# Patient Record
Sex: Female | Born: 2005
Health system: Southern US, Community
[De-identification: ages and names within clinical notes are randomized; demographics above are authoritative.]

## PROBLEM LIST (undated history)

## (undated) DIAGNOSIS — F32A Depression, unspecified: Secondary | ICD-10-CM

## (undated) DIAGNOSIS — F419 Anxiety disorder, unspecified: Secondary | ICD-10-CM

## (undated) DIAGNOSIS — K141 Geographic tongue: Secondary | ICD-10-CM

## (undated) DIAGNOSIS — F329 Major depressive disorder, single episode, unspecified: Secondary | ICD-10-CM

## (undated) DIAGNOSIS — F840 Autistic disorder: Secondary | ICD-10-CM

## (undated) DIAGNOSIS — J45909 Unspecified asthma, uncomplicated: Secondary | ICD-10-CM

## (undated) DIAGNOSIS — F4310 Post-traumatic stress disorder, unspecified: Secondary | ICD-10-CM

---
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
## 2024-02-04 ENCOUNTER — Encounter (HOSPITAL_BASED_OUTPATIENT_CLINIC_OR_DEPARTMENT_OTHER): Payer: Self-pay

## 2024-02-04 ENCOUNTER — Emergency Department (HOSPITAL_BASED_OUTPATIENT_CLINIC_OR_DEPARTMENT_OTHER): Payer: 59

## 2024-02-04 ENCOUNTER — Emergency Department (HOSPITAL_BASED_OUTPATIENT_CLINIC_OR_DEPARTMENT_OTHER): Payer: 59 | Admitting: Radiology

## 2024-02-04 ENCOUNTER — Observation Stay (HOSPITAL_BASED_OUTPATIENT_CLINIC_OR_DEPARTMENT_OTHER)
Admission: EM | Admit: 2024-02-04 | Discharge: 2024-02-07 | Disposition: A | Discharge status: Home / Self Care | Payer: 59 | Attending: Internal Medicine | Admitting: Internal Medicine

## 2024-02-04 ENCOUNTER — Other Ambulatory Visit: Payer: Self-pay

## 2024-02-04 DIAGNOSIS — R55 Syncope and collapse: Principal | ICD-10-CM | POA: Insufficient documentation

## 2024-02-04 DIAGNOSIS — Z79899 Other long term (current) drug therapy: Secondary | ICD-10-CM | POA: Insufficient documentation

## 2024-02-04 DIAGNOSIS — Z1152 Encounter for screening for COVID-19: Secondary | ICD-10-CM | POA: Insufficient documentation

## 2024-02-04 DIAGNOSIS — F840 Autistic disorder: Secondary | ICD-10-CM | POA: Diagnosis not present

## 2024-02-04 DIAGNOSIS — R519 Headache, unspecified: Secondary | ICD-10-CM | POA: Insufficient documentation

## 2024-02-04 DIAGNOSIS — E876 Hypokalemia: Secondary | ICD-10-CM | POA: Diagnosis not present

## 2024-02-04 DIAGNOSIS — J45909 Unspecified asthma, uncomplicated: Secondary | ICD-10-CM | POA: Diagnosis not present

## 2024-02-04 LAB — RAPID URINE DRUG SCREEN, HOSP PERFORMED
Amphetamines: NOT DETECTED
Barbiturates: NOT DETECTED
Benzodiazepines: NOT DETECTED
Cocaine: NOT DETECTED
Opiates: NOT DETECTED
Tetrahydrocannabinol: POSITIVE — AB

## 2024-02-04 LAB — RESP PANEL BY RT-PCR (RSV, FLU A&B, COVID)  RVPGX2
Influenza A by PCR: NEGATIVE
Influenza B by PCR: NEGATIVE
Resp Syncytial Virus by PCR: NEGATIVE
SARS Coronavirus 2 by RT PCR: NEGATIVE

## 2024-02-04 LAB — CBC WITH DIFFERENTIAL/PLATELET
Abs Immature Granulocytes: 0.01 10*3/uL (ref 0.00–0.07)
Basophils Absolute: 0.1 10*3/uL (ref 0.0–0.1)
Basophils Relative: 1 %
Eosinophils Absolute: 0 10*3/uL (ref 0.0–0.5)
Eosinophils Relative: 1 %
HCT: 36.1 % (ref 36.0–46.0)
Hemoglobin: 12 g/dL (ref 12.0–15.0)
Immature Granulocytes: 0 %
Lymphocytes Relative: 48 %
Lymphs Abs: 3.8 10*3/uL (ref 0.7–4.0)
MCH: 30.5 pg (ref 26.0–34.0)
MCHC: 33.2 g/dL (ref 30.0–36.0)
MCV: 91.9 fL (ref 80.0–100.0)
Monocytes Absolute: 0.6 10*3/uL (ref 0.1–1.0)
Monocytes Relative: 7 %
Neutro Abs: 3.3 10*3/uL (ref 1.7–7.7)
Neutrophils Relative %: 43 %
Platelets: 332 10*3/uL (ref 150–400)
RBC: 3.93 MIL/uL (ref 3.87–5.11)
RDW: 13.6 % (ref 11.5–15.5)
WBC: 7.8 10*3/uL (ref 4.0–10.5)
nRBC: 0 % (ref 0.0–0.2)

## 2024-02-04 LAB — MAGNESIUM: Magnesium: 2.1 mg/dL (ref 1.7–2.4)

## 2024-02-04 LAB — TROPONIN I (HIGH SENSITIVITY): Troponin I (High Sensitivity): <2 ng/L (ref <18)

## 2024-02-04 LAB — COMPREHENSIVE METABOLIC PANEL
ALT: 11 U/L (ref 0–44)
AST: 13 U/L — ABNORMAL LOW (ref 15–41)
Albumin: 4.6 g/dL (ref 3.5–5.0)
Alkaline Phosphatase: 55 U/L (ref 38–126)
Anion gap: 10 (ref 5–15)
BUN: 5 mg/dL — ABNORMAL LOW (ref 6–20)
CO2: 25 mmol/L (ref 22–32)
Calcium: 9.9 mg/dL (ref 8.9–10.3)
Chloride: 103 mmol/L (ref 98–111)
Creatinine, Ser: 0.89 mg/dL (ref 0.44–1.00)
GFR, Estimated: >60 mL/min (ref >60)
Glucose, Bld: 91 mg/dL (ref 70–99)
Potassium: 3.3 mmol/L — ABNORMAL LOW (ref 3.5–5.1)
Sodium: 138 mmol/L (ref 135–145)
Total Bilirubin: 0.8 mg/dL (ref 0.0–1.2)
Total Protein: 8 g/dL (ref 6.5–8.1)

## 2024-02-04 MED ORDER — SODIUM CHLORIDE 0.9 % IV BOLUS
1000.0000 mL | Freq: Once | INTRAVENOUS | Status: AC
  Administered 2024-02-04: 1000 mL via INTRAVENOUS

## 2024-02-04 NOTE — ED Triage Notes (Signed)
 During triage patient began gasping and holding chest.  Gasping breathing.  Becomes unresponsive for short period.  Body become rigid for short period.  Becomes responsive quickly .  Two episodes in triage  First episode patient  fell out of wheel chair and assisted to floor.  Patient states when this happens feels pain to chest and blurry vision followed by flashing lights and then blackness.

## 2024-02-04 NOTE — Progress Notes (Signed)
 Plan of Care Note for accepted transfer   Patient: Carolyn Pitts MRN: [id number]   DOA: 02/04/2024  Facility requesting transfer: [facility]   Requesting Provider: Dr. Cottie   Reason for transfer: Recurrent LOC   Facility course: 17 year old female with history of autism, PTSD, OCD, depression, and anxiety presents with recurrent episodes of loss of consciousness.  She has had multiple recent episodes marked by chest discomfort, acute shortness of breath, and vision change followed by brief LOC and subsequent lethargy.  She is in sinus tachycardia on EKG.  No acute findings seen on head CT.  Basic labs are essentially normal aside from mild hypokalemia.  She was given 1 L of NS.  Plan of care: The patient is accepted for admission to Telemetry unit, at [facility].   Author: Evalene GORMAN Sprinkles, MD 02/04/2024  Check [email or web address] for on-call coverage.  Nursing staff, Please call [facility] Admits & Consults System-Wide number on Amion as soon as patient's arrival, so appropriate admitting provider can evaluate the pt.

## 2024-02-04 NOTE — ED Triage Notes (Signed)
 Last nigh passed out at work.  States happen  last night  at home and this am at MD office.  States when episodes happen has short of breath and chest pain Mother states looks like neck swells.  States has happen about five time.  States lethargic but responsive afterwards

## 2024-02-04 NOTE — ED Provider Notes (Signed)
 [place] EMERGENCY DEPARTMENT AT [facility] Provider Note   CSN: [id number] Arrival date & time: 02/04/24  1506     History  Chief Complaint  Patient presents with   Loss of Consciousness    Carolyn Pitts is a 18 y.o. female presenting to the ED with complaint of chronic headaches, weakness, syncope for his near syncope episodes.  Patient's mother is present bedside.  They report that she has had headaches for a long long time.  She has been referred to neurology but not seen one yet and mother ports has not had neuroimaging.  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  She vomited several times in the bathroom and during these episodes she may have briefly lost consciousness.  They went to see her pediatrician today where she had another similar episode where she said my vision went dark and I feel like I was going to pass out.  She says she been having burning substernal chest pain since yesterday.  She and her mother deny that she has a prior history of syncope, or any other significant medical problems other than a possible vitamin deficiency.  HPI     Home Medications Prior to Admission medications   Medication Sig Start Date End Date Taking? Authorizing Provider  acetaminophen  (TYLENOL ) 500 MG tablet Take 500-1,000 mg by mouth every 6 (six) hours as needed for mild pain or headache.    [provider]  albuterol  (VENTOLIN  HFA) 108 (90 Base) MCG/ACT inhaler Inhale 1-2 puffs into the lungs every 6 (six) hours as needed for wheezing or shortness of breath.    [provider]  buPROPion (WELLBUTRIN XL) 150 MG 24 hr tablet Take 150 mg by mouth in the morning. 06/29/20   [provider]  calcium  carbonate (TUMS - DOSED IN MG ELEMENTAL CALCIUM ) 500 MG chewable tablet Chew 1-2 tablets by mouth as needed for indigestion or heartburn.     [provider]  famotidine  (PEPCID ) 40 MG tablet Take 1 tablet (40 mg total) by mouth daily. 07/03/20   Erasmo Waddell SAUNDERS, NP  FLUoxetine  (PROZAC ) 20 MG capsule Take 1 capsule (20 mg total) by mouth at bedtime. Patient not taking: Reported on 07/03/2020 09/01/19   Jonnalagadda, Janardhana, MD  HYDROcodone -acetaminophen  (NORCO/VICODIN) 5-325 MG tablet Take 1 tablet by mouth every 4 (four) hours as needed. 02/06/23   Yolande Lamar BROCKS, MD  hydrOXYzine  (ATARAX /VISTARIL ) 10 MG tablet Take 1 tablet (10 mg total) by mouth 3 (three) times daily as needed for anxiety. Patient not taking: Reported on 07/03/2020 09/01/19   Jonnalagadda, Janardhana, MD  ibuprofen  (ADVIL ) 200 MG tablet Take 400 mg by mouth every 6 (six) hours as needed for headache, mild pain or cramping.    [provider]  JUNEL FE 1/20 1-20 MG-MCG tablet Take 1 tablet by mouth See admin instructions. Take 1 tablet by mouth at bedtime on non-active days of monthly cycle 08/03/19   [provider]  methylphenidate 18 MG PO CR tablet Take 18 mg by mouth in the morning. 05/23/20   [provider]  Multiple Vitamins-Minerals (ONE-A-DAY TEEN ADVANTAGE/HER) TABS Take 1 tablet by mouth daily with breakfast.    [provider]  omeprazole (PRILOSEC) 40 MG capsule Take 40 mg by mouth daily before breakfast. 06/23/20   [provider]  ondansetron  (ZOFRAN ) 4 MG tablet Take 1 tablet (4 mg total) by mouth  every 6 (six) hours. 02/06/23   Yolande Lamar BROCKS, MD  polyethylene glycol (MIRALAX ) 17 g packet Take 17 g by mouth daily. 07/03/20   Erasmo Waddell SAUNDERS, NP  traZODone (DESYREL) 50 MG tablet Take 50 mg by mouth at bedtime. 06/29/20   [provider]  dicyclomine  (BENTYL ) 20 MG tablet Take 0.5 tablets (10 mg total) by mouth 2 (two) times daily as needed for spasms. Patient not taking: Reported on 08/26/2019 06/11/18 08/27/19  Lum Dorothyann RAMAN, NP      Allergies    Patient has no allergy information on record.     Review of Systems   Review of Systems  Physical Exam Updated Vital Signs BP 107/79   Pulse (!) 59   Temp 98.2 F (36.8 C) (Oral)   Resp 18   LMP 01/19/2024 (Approximate)   SpO2 99%  Physical Exam Constitutional:      General: She is not in acute distress. HENT:     Head: Normocephalic and atraumatic.  Eyes:     Conjunctiva/sclera: Conjunctivae normal.     Pupils: Pupils are equal, round, and reactive to light.  Cardiovascular:     Rate and Rhythm: Normal rate and regular rhythm.  Pulmonary:     Effort: Pulmonary effort is normal. No respiratory distress.  Abdominal:     General: There is no distension.     Tenderness: There is no abdominal tenderness.  Skin:    General: Skin is warm and dry.  Neurological:     General: No focal deficit present.     Mental Status: She is alert and oriented to person, place, and time. Mental status is at baseline.     Sensory: No sensory deficit.     Motor: No weakness.  Psychiatric:        Mood and Affect: Mood normal.        Behavior: Behavior normal.     ED Results / Procedures / Treatments   Labs (all labs ordered are listed, but only abnormal results are displayed) Labs Reviewed  COMPREHENSIVE METABOLIC PANEL - Abnormal; Notable for the following components:      Result Value   Potassium 3.3 (*)    BUN 5 (*)    AST 13 (*)    All other components within normal limits  RAPID URINE DRUG SCREEN, HOSP PERFORMED - Abnormal; Notable for the following components:   Tetrahydrocannabinol POSITIVE (*)    All other components within normal limits  RESP PANEL BY RT-PCR (RSV, FLU A&B, COVID)  RVPGX2  CBC WITH DIFFERENTIAL/PLATELET  MAGNESIUM   TROPONIN I (HIGH SENSITIVITY)    EKG EKG Interpretation Date/Time:  Wednesday February 04 2024 15:34:20 EST Ventricular Rate:  128 PR Interval:  164 QRS Duration:  86 QT Interval:  292 QTC Calculation: 426 R Axis:   34  Text Interpretation: Sinus tachycardia  Lead III isoelectric  poor amplitude When compared with ECG of 02-Dec-2021 12:47, PREVIOUS ECG IS PRESENT No significant changes Confirmed by Cottie Cough [phone]) on 02/04/2024 4:01:44 PM  Radiology DG Chest 2 View Result Date: 02/04/2024 CLINICAL DATA:  Syncope. EXAM: CHEST - 2 VIEW COMPARISON:  February 06, 2023. FINDINGS: The heart size and mediastinal contours are within normal limits. Both lungs are clear. The visualized skeletal structures are unremarkable. IMPRESSION: No active cardiopulmonary disease. Electronically Signed   By: Lynwood Landy Raddle M.D.   On: 02/04/2024 17:55   CT Head Wo Contrast Result Date: 02/04/2024 CLINICAL DATA:  Transient ischemic attack (TIA).  Syncope.  EXAM: CT HEAD WITHOUT CONTRAST TECHNIQUE: Contiguous axial images were obtained from the base of the skull through the vertex without intravenous contrast. RADIATION DOSE REDUCTION: This exam was performed according to the departmental dose-optimization program which includes automated exposure control, adjustment of the mA and/or kV according to patient size and/or use of iterative reconstruction technique. COMPARISON:  None Available. FINDINGS: Brain: There is no evidence of an acute infarct, intracranial hemorrhage, mass, midline shift, or extra-axial fluid collection. Cerebral volume is normal. The ventricles are normal in size. Vascular: No hyperdense vessel. Skull: No fracture or suspicious osseous lesion. Sinuses/Orbits: Visualized paranasal sinuses and mastoid air cells are clear. Unremarkable included orbits. Other: None. IMPRESSION: Negative head CT. Electronically Signed   By: Dasie Hamburg M.D.   On: 02/04/2024 17:01    Procedures Procedures    Medications Ordered in ED Medications  sodium chloride  0.9 % bolus 1,000 mL (0 mLs Intravenous Stopped 02/04/24 1900)    ED Course/ Medical Decision Making/ A&P Clinical Course as of 02/05/24 0029  Wed Feb 04, 2024  1919 I spoke to the patient privately, then with permission with her  mother present, about the THC positive in urine.  She reports she does smoke or use THC cartridges on a near nightly basis but denies any new change in his habits, not consuming any edibles, and neither of them believe that the marijuana is linked to these current symptoms.  Plan to admit for telemetry monitoring and echocardiogram [MT]    Clinical Course User Index [MT] Masyn Fullam, Donnice PARAS, MD                                 Medical Decision Making Risk Decision regarding hospitalization.   This patient presents to the ED with concern for near syncope for syncope, nausea vomiting, lightheadedness. This involves an extensive number of treatment options, and is a complaint that carries with it a high risk of complications and morbidity.  The differential diagnosis includes arrhythmia versus anemia versus dehydration versus other   Additional history obtained from the patient's mother at bedside   I ordered and personally interpreted labs.  The pertinent results include: No emergent findings.  Very mild hypokalemia.  UDS positive for THC, see ED course below  I ordered imaging studies including x-ray of the chest, CT head I independently visualized and interpreted imaging which showed no emergent findings I agree with the radiologist interpretation  The patient was maintained on a cardiac monitor.  I personally viewed and interpreted the cardiac monitored which showed an underlying rhythm of: Sinus rhythm  Per my interpretation the patient's ECG shows sinus rhythm without acute ischemic findings or evidence of dangerous arrhythmia  I ordered medication including IV fluid bolus for hydration I have reviewed the patients home medicines and have made adjustments as needed  Test Considered: Very low suspicion for acute PE, patient is PERC negative.  Doubt meningitis.  After the interventions noted above, I reevaluated the patient and found that they have: stayed the  same  Dispostion:  After consideration of the diagnostic results and the patients response to treatment, I feel that the patent would benefit from observation admission on telemetry for potential echocardiogram in the morning.  Patient and her mother reiterates that these syncope spells are quite new with no prior history to suggest vasovagal episodes.  There is no clear inciting factor -potentially viral, but unclear.  They are in agreement  with plan for observation stay and echocardiogram.  Separately the patient has had near daily headaches extending back for several months, which she describes migraines.  They have been looking for a neurology referral, not conducive for the headache.  At this point I do not see any dangerous red flag etiology with her headaches to suggest that this is the underlying cause of her syncope.  Specifically I do not see evidence per history of subarachnoid hemorrhage, acute meningitis, or other dangerous medical condition related to her headache.         Final Clinical Impression(s) / ED Diagnoses Final diagnoses:  Near syncope  Nonintractable headache, unspecified chronicity pattern, unspecified headache type    Rx / DC Orders ED Discharge Orders          Ordered    Ambulatory referral to Neurology       Comments: An appointment is requested in approximately: 2 weeks Chronic migraine headaches   02/04/24 1920              Cottie Donnice PARAS, MD 02/05/24 [phone]

## 2024-02-04 NOTE — ED Provider Triage Note (Signed)
 Emergency Medicine Provider Triage Evaluation Note  Carolyn Pitts , a 18 y.o. female  was evaluated in triage.  Pt complains of syncopal episodes that have occurred numerous times since yesterday.  No prior history of the same.  She does have history of migraines.  According to mom these episodes last about 10 to 15 seconds.  She was sent here from PCP office.  She had a similar episode in triage.  She was clutching her chest and stating she could not breathe.  There was no postictal phase after.  No full loss of consciousness.  She slid down to the floor out of the chair  Review of Systems  Positive: As above Negative: As above  Physical Exam  BP 114/76 (BP Location: Left Arm)   Pulse 70   Temp 97.9 F (36.6 C)   Resp 18   SpO2 100%  Gen:   Awake, no distress. Resp:  Normal effort  MSK:   Moves extremities without difficulty  Other:    Medical Decision Making  Medically screening exam initiated at 3:31 PM.  Appropriate orders placed.  Carolyn Pitts was informed that the remainder of the evaluation will be completed by another provider, this initial triage assessment does not replace that evaluation, and the importance of remaining in the ED until their evaluation is complete.    Carolyn Loge, PA-C 02/04/24 1535

## 2024-02-05 ENCOUNTER — Ambulatory Visit (HOSPITAL_COMMUNITY): Payer: 59

## 2024-02-05 ENCOUNTER — Encounter (HOSPITAL_COMMUNITY): Payer: Self-pay | Admitting: Family Medicine

## 2024-02-05 ENCOUNTER — Observation Stay (HOSPITAL_COMMUNITY): Payer: 59

## 2024-02-05 DIAGNOSIS — Z79899 Other long term (current) drug therapy: Secondary | ICD-10-CM | POA: Diagnosis not present

## 2024-02-05 DIAGNOSIS — R519 Headache, unspecified: Secondary | ICD-10-CM | POA: Diagnosis not present

## 2024-02-05 DIAGNOSIS — F840 Autistic disorder: Secondary | ICD-10-CM | POA: Diagnosis not present

## 2024-02-05 DIAGNOSIS — E876 Hypokalemia: Secondary | ICD-10-CM

## 2024-02-05 DIAGNOSIS — R569 Unspecified convulsions: Secondary | ICD-10-CM

## 2024-02-05 DIAGNOSIS — R55 Syncope and collapse: Principal | ICD-10-CM

## 2024-02-05 DIAGNOSIS — J45909 Unspecified asthma, uncomplicated: Secondary | ICD-10-CM | POA: Insufficient documentation

## 2024-02-05 DIAGNOSIS — Z1152 Encounter for screening for COVID-19: Secondary | ICD-10-CM | POA: Diagnosis not present

## 2024-02-05 LAB — BASIC METABOLIC PANEL
Anion gap: 9 (ref 5–15)
BUN: <5 mg/dL — ABNORMAL LOW (ref 6–20)
CO2: 23 mmol/L (ref 22–32)
Calcium: 9.1 mg/dL (ref 8.9–10.3)
Chloride: 106 mmol/L (ref 98–111)
Creatinine, Ser: 0.85 mg/dL (ref 0.44–1.00)
GFR, Estimated: >60 mL/min (ref >60)
Glucose, Bld: 84 mg/dL (ref 70–99)
Potassium: 4.4 mmol/L (ref 3.5–5.1)
Sodium: 138 mmol/L (ref 135–145)

## 2024-02-05 LAB — ECHOCARDIOGRAM COMPLETE
AR max vel: 2.19 cm2
AV Area VTI: 2.4 cm2
AV Area mean vel: 2.16 cm2
AV Mean grad: 4 mm[Hg]
AV Peak grad: 7.2 mm[Hg]
Ao pk vel: 1.34 m/s
Area-P 1/2: 3.12 cm2
S' Lateral: 3.3 cm

## 2024-02-05 LAB — PREGNANCY, URINE: Preg Test, Ur: NEGATIVE

## 2024-02-05 LAB — CBC WITH DIFFERENTIAL/PLATELET
Abs Immature Granulocytes: 0.01 10*3/uL (ref 0.00–0.07)
Basophils Absolute: 0.1 10*3/uL (ref 0.0–0.1)
Basophils Relative: 1 %
Eosinophils Absolute: 0.1 10*3/uL (ref 0.0–0.5)
Eosinophils Relative: 1 %
HCT: 33.2 % — ABNORMAL LOW (ref 36.0–46.0)
Hemoglobin: 11.2 g/dL — ABNORMAL LOW (ref 12.0–15.0)
Immature Granulocytes: 0 %
Lymphocytes Relative: 56 %
Lymphs Abs: 3.1 10*3/uL (ref 0.7–4.0)
MCH: 30.9 pg (ref 26.0–34.0)
MCHC: 33.7 g/dL (ref 30.0–36.0)
MCV: 91.5 fL (ref 80.0–100.0)
Monocytes Absolute: 0.4 10*3/uL (ref 0.1–1.0)
Monocytes Relative: 7 %
Neutro Abs: 2 10*3/uL (ref 1.7–7.7)
Neutrophils Relative %: 35 %
Platelets: 273 10*3/uL (ref 150–400)
RBC: 3.63 MIL/uL — ABNORMAL LOW (ref 3.87–5.11)
RDW: 13.5 % (ref 11.5–15.5)
WBC: 5.5 10*3/uL (ref 4.0–10.5)
nRBC: 0 % (ref 0.0–0.2)

## 2024-02-05 LAB — VITAMIN B12: Vitamin B-12: 268 pg/mL (ref 180–914)

## 2024-02-05 LAB — T4, FREE: Free T4: 0.99 ng/dL (ref 0.61–1.12)

## 2024-02-05 LAB — FOLATE: Folate: 25 ng/mL (ref >5.9)

## 2024-02-05 LAB — D-DIMER, QUANTITATIVE: D-Dimer, Quant: <0.27 ug{FEU}/mL (ref 0.00–0.50)

## 2024-02-05 LAB — VITAMIN D 25 HYDROXY (VIT D DEFICIENCY, FRACTURES): Vit D, 25-Hydroxy: 25.21 ng/mL — ABNORMAL LOW (ref 30–100)

## 2024-02-05 LAB — MAGNESIUM: Magnesium: 2.1 mg/dL (ref 1.7–2.4)

## 2024-02-05 LAB — TSH: TSH: 1.617 u[IU]/mL (ref 0.350–4.500)

## 2024-02-05 LAB — HIV ANTIBODY (ROUTINE TESTING W REFLEX): HIV Screen 4th Generation wRfx: NONREACTIVE

## 2024-02-05 MED ORDER — POTASSIUM CHLORIDE 20 MEQ PO PACK
20.0000 meq | PACK | Freq: Once | ORAL | Status: AC
  Administered 2024-02-05: 20 meq via ORAL
  Filled 2024-02-05: qty 1

## 2024-02-05 MED ORDER — ENOXAPARIN SODIUM 40 MG/0.4ML IJ SOSY
40.0000 mg | PREFILLED_SYRINGE | Freq: Every day | INTRAMUSCULAR | Status: DC
  Administered 2024-02-05 – 2024-02-07 (×3): 40 mg via SUBCUTANEOUS
  Filled 2024-02-05 (×3): qty 0.4

## 2024-02-05 MED ORDER — ALBUTEROL SULFATE (2.5 MG/3ML) 0.083% IN NEBU
2.5000 mg | INHALATION_SOLUTION | Freq: Four times a day (QID) | RESPIRATORY_TRACT | Status: DC | PRN
  Filled 2024-02-05: qty 3

## 2024-02-05 MED ORDER — DIPHENHYDRAMINE HCL 25 MG PO CAPS
25.0000 mg | ORAL_CAPSULE | Freq: Three times a day (TID) | ORAL | Status: DC | PRN
  Administered 2024-02-05: 25 mg via ORAL
  Filled 2024-02-05: qty 1

## 2024-02-05 MED ORDER — CALCIUM CARBONATE ANTACID 500 MG PO CHEW
1.0000 | CHEWABLE_TABLET | Freq: Two times a day (BID) | ORAL | Status: DC | PRN
  Administered 2024-02-05: 200 mg via ORAL
  Filled 2024-02-05: qty 1

## 2024-02-05 NOTE — Progress Notes (Signed)
  Echocardiogram 2D Echocardiogram has been performed.  Carolyn Pitts 02/05/2024, 11:28 AM

## 2024-02-05 NOTE — H&P (Signed)
 History and Physical    Carolyn Pitts FMW:[id number] DOB: 26-Jun-2006 DOA: 02/04/2024  Patient coming from: Home.  Chief Complaint: Loss of consciousness.  HPI: Carolyn Pitts is a 18 y.o. female with history of asthma, autism was brought to the ER as patient had about 5 episodes of syncope.  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  This happened twice in a short interval.  Then she called her mom and she was taken home and at home she had again nausea but no vomiting but again had a brief episode of loss of consciousness.  This was followed by another episode.  During these episodes she did not have any incontinence of urine or bowel or tongue bite.  Patient states recently she has been having a stressful life but denies any suicidal thoughts.  Admits to using marijuana.  ED Course: In the ER patient was initially tachycardic.  Was given fluid bolus.  EKG shows sinus tachycardia.  Troponins were negative.  Potassium 3.3.  Urine drug screen is positive for marijuana.  CT head and chest x-ray was unremarkable.  Patient admitted for further workup for syncope.  Review of Systems: As per HPI, rest all negative.   Past Medical History:  Diagnosis Date   Anxiety    Asthma    Autism    Depression    Geographical tongue    PTSD (post-traumatic stress disorder)     History reviewed. No pertinent surgical history.   reports that she has never smoked. She has never used smokeless tobacco. She reports that she does not drink alcohol and does not use drugs.  Not on File  History reviewed. No pertinent family history.  Prior to Admission medications   Medication Sig Start Date End Date Taking?  Authorizing Provider  acetaminophen  (TYLENOL ) 500 MG tablet Take 500-1,000 mg by mouth every 6 (six) hours as needed for mild pain or headache.    [provider]  albuterol  (VENTOLIN  HFA) 108 (90 Base) MCG/ACT inhaler Inhale 1-2 puffs into the lungs every 6 (six) hours as needed for wheezing or shortness of breath.    [provider]  buPROPion (WELLBUTRIN XL) 150 MG 24 hr tablet Take 150 mg by mouth in the morning. 06/29/20   [provider]  calcium  carbonate (TUMS - DOSED IN MG ELEMENTAL CALCIUM ) 500 MG chewable tablet Chew 1-2 tablets by mouth as needed for indigestion or heartburn.    [provider]  famotidine  (PEPCID ) 40 MG tablet Take 1 tablet (40 mg total) by mouth daily. 07/03/20   Erasmo Waddell SAUNDERS, NP  FLUoxetine  (PROZAC ) 20 MG capsule Take 1 capsule (20 mg total) by mouth at bedtime. Patient not taking: Reported on 07/03/2020 09/01/19   Jonnalagadda, Janardhana, MD  HYDROcodone -acetaminophen  (NORCO/VICODIN) 5-325 MG tablet Take 1 tablet by mouth every 4 (four) hours as needed. 02/06/23   Yolande Lamar BROCKS, MD  hydrOXYzine  (ATARAX /VISTARIL ) 10 MG tablet Take 1 tablet (10 mg total) by mouth 3 (three) times daily as needed for anxiety. Patient not taking: Reported on 07/03/2020 09/01/19   Jonnalagadda, Janardhana, MD  ibuprofen  (ADVIL ) 200 MG tablet Take 400 mg by mouth every 6 (six) hours as needed for headache, mild pain or  cramping.    [provider]  JUNEL FE 1/20 1-20 MG-MCG tablet Take 1 tablet by mouth See admin instructions. Take 1 tablet by mouth at bedtime on non-active days of monthly cycle 08/03/19   [provider]  methylphenidate 18 MG PO CR tablet Take 18 mg by mouth in the morning. 05/23/20   [provider]  Multiple Vitamins-Minerals (ONE-A-DAY TEEN ADVANTAGE/HER) TABS Take 1 tablet by mouth daily with breakfast.    [provider]  omeprazole (PRILOSEC) 40 MG capsule Take 40 mg by mouth daily before breakfast.  06/23/20   [provider]  ondansetron  (ZOFRAN ) 4 MG tablet Take 1 tablet (4 mg total) by mouth every 6 (six) hours. 02/06/23   Yolande Lamar BROCKS, MD  polyethylene glycol (MIRALAX ) 17 g packet Take 17 g by mouth daily. 07/03/20   Erasmo Waddell SAUNDERS, NP  traZODone (DESYREL) 50 MG tablet Take 50 mg by mouth at bedtime. 06/29/20   [provider]  dicyclomine  (BENTYL ) 20 MG tablet Take 0.5 tablets (10 mg total) by mouth 2 (two) times daily as needed for spasms. Patient not taking: Reported on 08/26/2019 06/11/18 08/27/19  Lum Dorothyann RAMAN, NP    Physical Exam: Constitutional: Moderately built and nourished. Vitals:   02/05/24 0016 02/05/24 0100 02/05/24 0200 02/05/24 0256  BP:  105/85 100/70 110/65  Pulse:  73 62 (!) 58  Resp:  18 15 20   Temp: 98.2 F (36.8 C)   98.2 F (36.8 C)  TempSrc: Oral     SpO2:  99% 100% 99%   Eyes: Anicteric no pallor. ENMT: No discharge from the ears eyes nose or mouth. Neck: No mass felt.  No neck rigidity. Respiratory: No rhonchi or crepitations. Cardiovascular: S1-S2 heard. Abdomen: Soft nontender bowel sounds present. Musculoskeletal: No edema. Skin: No rash. Neurologic: Alert awake oriented to time place and person.  Moves all extremities 5 x 5.  No facial asymmetry tongue is midline pupils are equal and reacting to light. Psychiatric: Denies any suicidal thoughts.   Labs on Admission: I have personally reviewed following labs and imaging studies  CBC: Recent Labs  Lab 02/04/24 1545  WBC 7.8  NEUTROABS 3.3  HGB 12.0  HCT 36.1  MCV 91.9  PLT 332   Basic Metabolic Panel: Recent Labs  Lab 02/04/24 1545  NA 138  K 3.3*  CL 103  CO2 25  GLUCOSE 91  BUN 5*  CREATININE 0.89  CALCIUM  9.9  MG 2.1   GFR: CrCl cannot be calculated (Unknown ideal weight.). Liver Function Tests: Recent Labs  Lab 02/04/24 1545  AST 13*  ALT 11  ALKPHOS 55  BILITOT 0.8  PROT 8.0  ALBUMIN 4.6   No results for input(s): LIPASE, AMYLASE  in the last 168 hours. No results for input(s): AMMONIA in the last 168 hours. Coagulation Profile: No results for input(s): INR, PROTIME in the last 168 hours. Cardiac Enzymes: No results for input(s): CKTOTAL, CKMB, CKMBINDEX, TROPONINI in the last 168 hours. BNP (last 3 results) No results for input(s): PROBNP in the last 8760 hours. HbA1C: No results for input(s): HGBA1C in the last 72 hours. CBG: No results for input(s): GLUCAP in the last 168 hours. Lipid Profile: No results for input(s): CHOL, HDL, LDLCALC, TRIG, CHOLHDL, LDLDIRECT in the last 72 hours. Thyroid  Function Tests: No results for input(s): TSH, T4TOTAL, FREET4, T3FREE, THYROIDAB in the last 72 hours. Anemia Panel: No results for input(s): VITAMINB12, FOLATE, FERRITIN, TIBC, IRON, RETICCTPCT in the last 72 hours.  Urine analysis:    Component Value Date/Time   COLORURINE YELLOW 02/06/2023 1429   APPEARANCEUR CLEAR 02/06/2023 1429   LABSPEC 1.030 02/06/2023 1429   PHURINE 5.5 02/06/2023 1429   GLUCOSEU NEGATIVE 02/06/2023 1429   HGBUR NEGATIVE 02/06/2023 1429   BILIRUBINUR NEGATIVE 02/06/2023 1429   KETONESUR >80 (A) 02/06/2023 1429   PROTEINUR TRACE (A) 02/06/2023 1429   NITRITE NEGATIVE 02/06/2023 1429   LEUKOCYTESUR NEGATIVE 02/06/2023 1429   Sepsis Labs: @LABRCNTIP (procalcitonin:4,lacticidven:4) ) Recent Results (from the past 240 hours)  Resp panel by RT-PCR (RSV, Flu A&B, Covid) Anterior Nasal Swab     Status: None   Collection Time: 02/04/24  5:10 PM   Specimen: Anterior Nasal Swab  Result Value Ref Range Status   SARS Coronavirus 2 by RT PCR NEGATIVE NEGATIVE Final    Comment: (NOTE) SARS-CoV-2 target nucleic acids are NOT DETECTED.  The SARS-CoV-2 RNA is generally detectable in upper respiratory specimens during the acute phase of infection. The lowest concentration of SARS-CoV-2 viral copies this assay can detect is 138 copies/mL. A  negative result does not preclude SARS-Cov-2 infection and should not be used as the sole basis for treatment or other patient management decisions. A negative result may occur with  improper specimen collection/handling, submission of specimen other than nasopharyngeal swab, presence of viral mutation(s) within the areas targeted by this assay, and inadequate number of viral copies(<138 copies/mL). A negative result must be combined with clinical observations, patient history, and epidemiological information. The expected result is Negative.  Fact Sheet for Patients:  bloggercourse.com  Fact Sheet for Healthcare Providers:  seriousbroker.it  This test is no t yet approved or cleared by the [place]  FDA and  has been authorized for detection and/or diagnosis of SARS-CoV-2 by FDA under an Emergency Use Authorization (EUA). This EUA will remain  in effect (meaning this test can be used) for the duration of the COVID-19 declaration under Section 564(b)(1) of the Act, 21 U.S.C.section 360bbb-3(b)(1), unless the authorization is terminated  or revoked sooner.       Influenza A by PCR NEGATIVE NEGATIVE Final   Influenza B by PCR NEGATIVE NEGATIVE Final    Comment: (NOTE) The Xpert Xpress SARS-CoV-2/FLU/RSV plus assay is intended as an aid in the diagnosis of influenza from Nasopharyngeal swab specimens and should not be used as a sole basis for treatment. Nasal washings and aspirates are unacceptable for Xpert Xpress SARS-CoV-2/FLU/RSV testing.  Fact Sheet for Patients: bloggercourse.com  Fact Sheet for Healthcare Providers: seriousbroker.it  This test is not yet approved or cleared by the [place]  FDA and has been authorized for detection and/or diagnosis of SARS-CoV-2 by FDA under an Emergency Use Authorization (EUA). This EUA will remain in effect (meaning this test can  be used) for the duration of the COVID-19 declaration under Section 564(b)(1) of the Act, 21 U.S.C. section 360bbb-3(b)(1), unless the authorization is terminated or revoked.     Resp Syncytial Virus by PCR NEGATIVE NEGATIVE Final    Comment: (NOTE) Fact Sheet for Patients: bloggercourse.com  Fact Sheet for Healthcare Providers: seriousbroker.it  This test is not yet approved or cleared by the [place]  FDA and has been authorized for detection and/or diagnosis of SARS-CoV-2 by FDA under an Emergency Use Authorization (EUA). This EUA will remain in effect (meaning this test can be used) for the duration of the COVID-19 declaration under Section 564(b)(1) of the Act, 21 U.S.C. section 360bbb-3(b)(1), unless the authorization is terminated or revoked.  Performed at [facility], [address], [place], [place] [address]      Radiological Exams on Admission: DG Chest 2 View Result Date: 02/04/2024 CLINICAL DATA:  Syncope. EXAM: CHEST - 2 VIEW COMPARISON:  February 06, 2023. FINDINGS: The heart size and mediastinal contours are within normal limits. Both lungs are clear. The visualized skeletal structures are unremarkable. IMPRESSION: No active cardiopulmonary disease. Electronically Signed   By: Lynwood Landy Raddle M.D.   On: 02/04/2024 17:55   CT Head Wo Contrast Result Date: 02/04/2024 CLINICAL DATA:  Transient ischemic attack (TIA).  Syncope. EXAM: CT HEAD WITHOUT CONTRAST TECHNIQUE: Contiguous axial images were obtained from the base of the skull through the vertex without intravenous contrast. RADIATION DOSE REDUCTION: This exam was performed according to the departmental dose-optimization program which includes automated exposure control, adjustment of the mA and/or kV according to patient size and/or use of iterative reconstruction technique. COMPARISON:  None Available. FINDINGS: Brain: There is no evidence of an  acute infarct, intracranial hemorrhage, mass, midline shift, or extra-axial fluid collection. Cerebral volume is normal. The ventricles are normal in size. Vascular: No hyperdense vessel. Skull: No fracture or suspicious osseous lesion. Sinuses/Orbits: Visualized paranasal sinuses and mastoid air cells are clear. Unremarkable included orbits. Other: None. IMPRESSION: Negative head CT. Electronically Signed   By: Dasie Hamburg M.D.   On: 02/04/2024 17:01    EKG: Independently reviewed.  Sinus tachycardia.  Assessment/Plan Principal Problem:   Syncope Active Problems:   Autism   Asthma   Hypokalemia    Syncope -   patient's symptoms are concerning for syncope could be vasovagal.  Will monitor on telemetry check orthostatics check EEG check D-dimer since patient was tachycardic at presentation check TSH.  Hypokalemia likely from vomiting.  Replace recheck. History of asthma takes as needed albuterol .  Presently not wheezing. History of autism follows with therapist.  Pregnancy screen is pending.   Since patient has syncopal episode will need close monitoring further workup and more than 2 midnight stay.   DVT prophylaxis: Lovenox . Code Status: Full code. Family Communication: Discussed with patient. Disposition Plan: Monitored bed. Consults called: None. Admission status: Observation.

## 2024-02-05 NOTE — Procedures (Signed)
 Patient Name: Carolyn Pitts  MRN: [id number]  Epilepsy Attending: Arlin MALVA Krebs  Referring Physician/Provider: Franky Redia SAILOR, MD  Date: 02/05/2024 Duration: 22.27 mins  Patient history: 18yo F with 5 episodes of syncope. EEG to evaluate for seizure  Level of alertness: Awake, drowsy  AEDs during EEG study: None  Technical aspects: This EEG study was done with scalp electrodes positioned according to the 10-20 International system of electrode placement. Electrical activity was reviewed with band pass filter of 1-70Hz , sensitivity of 7 uV/mm, display speed of 56mm/sec with a 60Hz  notched filter applied as appropriate. EEG data were recorded continuously and digitally stored.  Video monitoring was available and reviewed as appropriate.  Description: The posterior dominant rhythm consists of 10 Hz activity of moderate voltage (25-35 uV) seen predominantly in posterior head regions, symmetric and reactive to eye opening and eye closing. Drowsiness was characterized by attenuation of the posterior background rhythm. Physiologic photic driving was not seen during photic stimulation.  Hyperventilation was not performed.     During photic stimulation, patient reported feeling confused and dizzy similar to how she feels before her vasovagal episode but intensity.  Concomitant EEG before, during and after the event did not show any EEG changes with a seizure.  IMPRESSION: This study is within normal limits. No seizures or epileptiform discharges were seen throughout the recording.  During photic stimulation,  patient reported feeling confused and dizzy similar to how she feels before her vasovagal episode but intensity without concomitant EEG change.  This was a nonepileptic event.  A normal interictal EEG does not exclude nor support the diagnosis of epilepsy.   Hensley Aziz O Juliet Vasbinder

## 2024-02-05 NOTE — Progress Notes (Addendum)
   Patient seen and examined, admitted earlier this morning by Dr. 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  Called her mom went home, went to her pediatrician's office yesterday, had another episode of nausea, retching followed by brief loss of consciousness.  No episodes of seizure-like activity, no bowel or bladder incontinence.  Denies EtOH use, admits to marijuana use occasionally, no longer on Ritalin or Wellbutrin as listed on med rec. -In the ER initially in sinus tachycardia, subsequently improved, labs only remarkable for mild hypokalemia, UDS positive for cannabis, TSH normal, D-dimer normal, urine pregnancy negative  Recurrent syncope -I agree with Dr. Franky, her clear GI symptoms with nausea, dry heaving or vomiting often preceding the syncopal episodes this is likely vasovagal -Orthostatics were negative, TSH normal -Agree with checking EEG and 2D echo for completeness -Increase activity today, ambulate -Home tomorrow if above workup is unremarkable  Hypokalemia Replace likely related to recent vomiting  History of frequent nausea, multiple food intolerances and allergies -Would benefit from gastroenterology eval, allergy immunology and good nutritionist -Educated to avoid all processed food  History of tingling, numbness -Considering multiple food intolerances, could have micronutrient deficiencies -Check B12, folate, vitamin D , zinc   History of asthma -No wheezing at this time  Depression -Stable, no longer on Wellbutrin, denies suicidal ideation  Sigurd Pac, MD

## 2024-02-05 NOTE — Plan of Care (Signed)

## 2024-02-05 NOTE — Progress Notes (Signed)
 EEG complete - results pending

## 2024-02-06 DIAGNOSIS — F488 Other specified nonpsychotic mental disorders: Secondary | ICD-10-CM | POA: Diagnosis not present

## 2024-02-06 MED ORDER — ONDANSETRON HCL 4 MG/2ML IJ SOLN
4.0000 mg | Freq: Four times a day (QID) | INTRAMUSCULAR | Status: DC | PRN
  Administered 2024-02-06 – 2024-02-07 (×2): 4 mg via INTRAVENOUS
  Filled 2024-02-06: qty 2

## 2024-02-06 MED ORDER — MECLIZINE HCL 12.5 MG PO TABS: 12.5000 mg | ORAL_TABLET | Freq: Three times a day (TID) | ORAL | Status: DC | PRN

## 2024-02-06 MED ORDER — VITAMIN B-12 100 MCG PO TABS
100.0000 ug | ORAL_TABLET | Freq: Every day | ORAL | Status: DC
  Administered 2024-02-07: 100 ug via ORAL
  Filled 2024-02-06: qty 1

## 2024-02-06 MED ORDER — VITAMIN D 25 MCG (1000 UNIT) PO TABS
1000.0000 [IU] | ORAL_TABLET | Freq: Every day | ORAL | Status: DC
  Administered 2024-02-07: 1000 [IU] via ORAL
  Filled 2024-02-06: qty 1

## 2024-02-06 NOTE — Progress Notes (Signed)
 PROGRESS NOTE    Carolyn Pitts  FMW:[id number] DOB: Mar 12, 2006 DOA: 02/04/2024 PCP: [facility]  Outpatient Specialists:     Brief Narrative:  Patient is an 18 year old female with significant psychiatric history, including but not limited to, depression, anxiety and panic attack, adjustment disorder, suicidal ideations and query behavior, reported autism and Tourette's syndrome, and asthma.  Patient reports current stressors.  Patient was admitted with reported syncope.  Patient reports sudden onset of chest tightness, blurry vision, nausea and vomiting prior to syncopal events.  Patient also reports the room spinning.  Toxicology is positive for tetrahydrocannabinol.  CT head without contrast is negative.  Chest x-ray is negative.  EKG reveals normal sinus rhythm.  Echocardiogram is nonrevealing with left ventricular EF of 5 to 60%.  EEG was negative for seizures.  Telemetry monitoring has revealed very few episodes of sinus tachycardia, in the setting of high emotion/anxiety.  02/06/2024: Patient initially seen alongside patient's mother.  Patient was eventually seen alone.  Patient reports several anniversaries in the last 1 month.  Patient reports being under stress.  Patient reports recent problem with a girlfriend that lives in [place] , but now resolved.  Patient thinks that her syncope may be related to her psychiatric problems.  Patient has been admitted to psychiatry unit in the past.  Patient was followed up by the psychiatric team for a long time afterwards.  No constitutional symptoms endorsed.   Assessment & Plan:   Principal Problem:   Syncope Active Problems:   Autism   Asthma   Hypokalemia   Syncope: -See above documentation. -Negative workup so far. -Cannot rule out supratentorial component to patient's clinical syndrome. -Will consult psychiatric team.  Charge nurse of the unit informed about psychiatric consult. -Continue to assess for possible  organic etiology. -Low threshold to obtain neurology and cardiology consult.  Hypokalemia: -Potassium was 3.3 on 02/04/2024. -Last visualized potassium was 4.4 (on 02/05/2024).  Possible depressive illness with somatic syndrome versus possible adjustment disorder with depressive symptoms versus somatoform disorder: -Patient denied history of borderline personality. -Consult psychiatric team.  Vitamin D  deficiency: -25-hydroxy vitamin D  level was 25.21. -Start cholecalciferol .  Vitamin B12 deficiency: -Vitamin B12 is low normal (268). -Start patient on vitamin B-12 -Patient has GI history (details unclear at the moment)  Anemia: -MCV of 91.5. -Replete vitamin B12 as above. -Folate level is normal. -Iron studies not visualized.   DVT prophylaxis:  Code Status: Full code. Family Communication: Mother. Disposition Plan: Observation.   Consultants:  Psychiatric team consulted.  Charge nurse informed.  Also communicated with patient's nurse.    Procedures:  None.  Antimicrobials:  None.   Subjective: Syncope.  Objective: Vitals:   02/06/24 0423 02/06/24 0729 02/06/24 1722 02/06/24 2017  BP: 100/63 119/79 100/69 104/67  Pulse: (!) 59 95 88 69  Resp: 20 20 18 18   Temp: 98.3 F (36.8 C) 98 F (36.7 C) 98.4 F (36.9 C) 98.2 F (36.8 C)  TempSrc:  Oral Oral   SpO2: 100% 100% 100% 100%   No intake or output data in the 24 hours ending 02/06/24 2056 There were no vitals filed for this visit.  Examination:  General exam: Appears calm and comfortable  Respiratory system: Clear to auscultation.  Cardiovascular system: S1 & S2  Gastrointestinal system: Abdomen is soft and nontender.   Central nervous system: Alert and oriented.  Patient moves all extremities. Extremities: No leg edema.    Data Reviewed: I have personally reviewed following labs and imaging studies  CBC: Recent Labs  Lab 02/04/24 1545 02/05/24 0707  WBC 7.8 5.5  NEUTROABS 3.3 2.0  HGB 12.0  11.2*  HCT 36.1 33.2*  MCV 91.9 91.5  PLT 332 273   Basic Metabolic Panel: Recent Labs  Lab 02/04/24 1545 02/05/24 0707  NA 138 138  K 3.3* 4.4  CL 103 106  CO2 25 23  GLUCOSE 91 84  BUN 5* <5*  CREATININE 0.89 0.85  CALCIUM  9.9 9.1  MG 2.1 2.1   GFR: CrCl cannot be calculated (Unknown ideal weight.). Liver Function Tests: Recent Labs  Lab 02/04/24 1545  AST 13*  ALT 11  ALKPHOS 55  BILITOT 0.8  PROT 8.0  ALBUMIN 4.6   No results for input(s): LIPASE, AMYLASE in the last 168 hours. No results for input(s): AMMONIA in the last 168 hours. Coagulation Profile: No results for input(s): INR, PROTIME in the last 168 hours. Cardiac Enzymes: No results for input(s): CKTOTAL, CKMB, CKMBINDEX, TROPONINI in the last 168 hours. BNP (last 3 results) No results for input(s): PROBNP in the last 8760 hours. HbA1C: No results for input(s): HGBA1C in the last 72 hours. CBG: No results for input(s): GLUCAP in the last 168 hours. Lipid Profile: No results for input(s): CHOL, HDL, LDLCALC, TRIG, CHOLHDL, LDLDIRECT in the last 72 hours. Thyroid  Function Tests: Recent Labs    02/05/24 0707 02/05/24 0740  TSH 1.617  --   FREET4  --  0.99   Anemia Panel: Recent Labs    02/05/24 1055  VITAMINB12 268  FOLATE 25.0   Urine analysis:    Component Value Date/Time   COLORURINE YELLOW 02/06/2023 1429   APPEARANCEUR CLEAR 02/06/2023 1429   LABSPEC 1.030 02/06/2023 1429   PHURINE 5.5 02/06/2023 1429   GLUCOSEU NEGATIVE 02/06/2023 1429   HGBUR NEGATIVE 02/06/2023 1429   BILIRUBINUR NEGATIVE 02/06/2023 1429   KETONESUR >80 (A) 02/06/2023 1429   PROTEINUR TRACE (A) 02/06/2023 1429   NITRITE NEGATIVE 02/06/2023 1429   LEUKOCYTESUR NEGATIVE 02/06/2023 1429   Sepsis Labs: @LABRCNTIP (procalcitonin:4,lacticidven:4)  ) Recent Results (from the past 240 hours)  Resp panel by RT-PCR (RSV, Flu A&B, Covid) Anterior Nasal Swab     Status: None    Collection Time: 02/04/24  5:10 PM   Specimen: Anterior Nasal Swab  Result Value Ref Range Status   SARS Coronavirus 2 by RT PCR NEGATIVE NEGATIVE Final    Comment: (NOTE) SARS-CoV-2 target nucleic acids are NOT DETECTED.  The SARS-CoV-2 RNA is generally detectable in upper respiratory specimens during the acute phase of infection. The lowest concentration of SARS-CoV-2 viral copies this assay can detect is 138 copies/mL. A negative result does not preclude SARS-Cov-2 infection and should not be used as the sole basis for treatment or other patient management decisions. A negative result may occur with  improper specimen collection/handling, submission of specimen other than nasopharyngeal swab, presence of viral mutation(s) within the areas targeted by this assay, and inadequate number of viral copies(<138 copies/mL). A negative result must be combined with clinical observations, patient history, and epidemiological information. The expected result is Negative.  Fact Sheet for Patients:  bloggercourse.com  Fact Sheet for Healthcare Providers:  seriousbroker.it  This test is no t yet approved or cleared by the [place]  FDA and  has been authorized for detection and/or diagnosis of SARS-CoV-2 by FDA under an Emergency Use Authorization (EUA). This EUA will remain  in effect (meaning this test can be used) for the duration of the COVID-19 declaration under Section 564(b)(1)  of the Act, 21 U.S.C.section 360bbb-3(b)(1), unless the authorization is terminated  or revoked sooner.       Influenza A by PCR NEGATIVE NEGATIVE Final   Influenza B by PCR NEGATIVE NEGATIVE Final    Comment: (NOTE) The Xpert Xpress SARS-CoV-2/FLU/RSV plus assay is intended as an aid in the diagnosis of influenza from Nasopharyngeal swab specimens and should not be used as a sole basis for treatment. Nasal washings and aspirates are unacceptable for  Xpert Xpress SARS-CoV-2/FLU/RSV testing.  Fact Sheet for Patients: bloggercourse.com  Fact Sheet for Healthcare Providers: seriousbroker.it  This test is not yet approved or cleared by the [place]  FDA and has been authorized for detection and/or diagnosis of SARS-CoV-2 by FDA under an Emergency Use Authorization (EUA). This EUA will remain in effect (meaning this test can be used) for the duration of the COVID-19 declaration under Section 564(b)(1) of the Act, 21 U.S.C. section 360bbb-3(b)(1), unless the authorization is terminated or revoked.     Resp Syncytial Virus by PCR NEGATIVE NEGATIVE Final    Comment: (NOTE) Fact Sheet for Patients: bloggercourse.com  Fact Sheet for Healthcare Providers: seriousbroker.it  This test is not yet approved or cleared by the [place]  FDA and has been authorized for detection and/or diagnosis of SARS-CoV-2 by FDA under an Emergency Use Authorization (EUA). This EUA will remain in effect (meaning this test can be used) for the duration of the COVID-19 declaration under Section 564(b)(1) of the Act, 21 U.S.C. section 360bbb-3(b)(1), unless the authorization is terminated or revoked.  Performed at [facility], [address], [place], [place] [address]          Radiology Studies: EEG adult Result Date: 02/05/2024 Shelton Arlin KIDD, MD     02/05/2024 12:25 PM Patient Name: Angelle Isais MRN: [id number] Epilepsy Attending: Arlin KIDD Shelton Referring Physician/Provider: Franky Redia SAILOR, MD Date: 02/05/2024 Duration: 22.27 mins Patient history: 18yo F with 5 episodes of syncope. EEG to evaluate for seizure Level of alertness: Awake, drowsy AEDs during EEG study: None Technical aspects: This EEG study was done with scalp electrodes positioned according to the 10-20 International system of electrode placement.  Electrical activity was reviewed with band pass filter of 1-70Hz , sensitivity of 7 uV/mm, display speed of 44mm/sec with a 60Hz  notched filter applied as appropriate. EEG data were recorded continuously and digitally stored.  Video monitoring was available and reviewed as appropriate. Description: The posterior dominant rhythm consists of 10 Hz activity of moderate voltage (25-35 uV) seen predominantly in posterior head regions, symmetric and reactive to eye opening and eye closing. Drowsiness was characterized by attenuation of the posterior background rhythm. Physiologic photic driving was not seen during photic stimulation.  Hyperventilation was not performed.   During photic stimulation, patient reported feeling confused and dizzy similar to how she feels before her vasovagal episode but intensity.  Concomitant EEG before, during and after the event did not show any EEG changes with a seizure. IMPRESSION: This study is within normal limits. No seizures or epileptiform discharges were seen throughout the recording. During photic stimulation,  patient reported feeling confused and dizzy similar to how she feels before her vasovagal episode but intensity without concomitant EEG change.  This was a nonepileptic event. A normal interictal EEG does not exclude nor support the diagnosis of epilepsy. Arlin KIDD Shelton   ECHOCARDIOGRAM COMPLETE Result Date: 02/05/2024    ECHOCARDIOGRAM REPORT   Patient Name:   MAYLEIGH TETRAULT Date of Exam: 02/05/2024 Medical Rec #:  [id number]  Height:       63.0 in Accession #:    [id number]        Weight:       118.0 lb Date of Birth:  [birth year]         BSA:          1.545 m Patient Age:    17 years          BP:           110/65 mmHg Patient Gender: F                 HR:           69 bpm. Exam Location:  Inpatient Procedure: 2D Echo, 3D Echo, Cardiac Doppler, Color Doppler and Strain Analysis Indications:    Syncope  Sonographer:    Ozell Free Referring Phys: [id number] ARSHAD N  KAKRAKANDY IMPRESSIONS  1. Left ventricular ejection fraction, by estimation, is 55 to 60%. The left ventricle has normal function. The left ventricle has no regional wall motion abnormalities. Left ventricular diastolic parameters were normal. The average left ventricular global longitudinal strain is -20.7 %. The global longitudinal strain is normal.  2. Right ventricular systolic function is normal. The right ventricular size is normal. There is moderately elevated pulmonary artery systolic pressure.  3. The mitral valve is normal in structure. Trivial mitral valve regurgitation. No evidence of mitral stenosis.  4. The aortic valve is normal in structure. Aortic valve regurgitation is not visualized. No aortic stenosis is present.  5. The inferior vena cava is normal in size with greater than 50% respiratory variability, suggesting right atrial pressure of 3 mmHg. FINDINGS  Left Ventricle: Left ventricular ejection fraction, by estimation, is 55 to 60%. The left ventricle has normal function. The left ventricle has no regional wall motion abnormalities. The average left ventricular global longitudinal strain is -20.7 %. The global longitudinal strain is normal. The left ventricular internal cavity size was normal in size. There is no left ventricular hypertrophy. Left ventricular diastolic parameters were normal. Right Ventricle: The right ventricular size is normal. No increase in right ventricular wall thickness. Right ventricular systolic function is normal. There is moderately elevated pulmonary artery systolic pressure. The tricuspid regurgitant velocity is 3.62 m/s, and with an assumed right atrial pressure of 3 mmHg, the estimated right ventricular systolic pressure is 55.4 mmHg. Left Atrium: Left atrial size was normal in size. Right Atrium: Right atrial size was normal in size. Pericardium: There is no evidence of pericardial effusion. Mitral Valve: The mitral valve is normal in structure. Trivial mitral  valve regurgitation. No evidence of mitral valve stenosis. Tricuspid Valve: The tricuspid valve is normal in structure. Tricuspid valve regurgitation is mild . No evidence of tricuspid stenosis. Aortic Valve: The aortic valve is normal in structure. Aortic valve regurgitation is not visualized. No aortic stenosis is present. Aortic valve mean gradient measures 4.0 mmHg. Aortic valve peak gradient measures 7.2 mmHg. Aortic valve area, by VTI measures 2.40 cm. Pulmonic Valve: The pulmonic valve was normal in structure. Pulmonic valve regurgitation is not visualized. No evidence of pulmonic stenosis. Aorta: The aortic root is normal in size and structure. Venous: The inferior vena cava is normal in size with greater than 50% respiratory variability, suggesting right atrial pressure of 3 mmHg. IAS/Shunts: No atrial level shunt detected by color flow Doppler.  LEFT VENTRICLE PLAX 2D LVIDd:         4.90 cm   Diastology LVIDs:  3.30 cm   LV e' medial:    14.30 cm/s LV PW:         0.70 cm   LV E/e' medial:  5.8 LV IVS:        0.80 cm   LV e' lateral:   21.50 cm/s LVOT diam:     2.00 cm   LV E/e' lateral: 3.9 LV SV:         59 LV SV Index:   38        2D Longitudinal Strain LVOT Area:     3.14 cm  2D Strain GLS Avg:     -20.7 %                           3D Volume EF:                          3D EF:        54 %                          LV EDV:       119 ml                          LV ESV:       55 ml                          LV SV:        65 ml RIGHT VENTRICLE             IVC RV Basal diam:  3.30 cm     IVC diam: 1.60 cm RV S prime:     11.70 cm/s TAPSE (M-mode): 2.3 cm LEFT ATRIUM             Index        RIGHT ATRIUM           Index LA diam:        2.70 cm 1.75 cm/m   RA Area:     12.20 cm LA Vol (A2C):   30.1 ml 19.48 ml/m  RA Volume:   29.40 ml  19.03 ml/m LA Vol (A4C):   30.2 ml 19.54 ml/m LA Biplane Vol: 32.5 ml 21.03 ml/m  AORTIC VALVE AV Area (Vmax):    2.19 cm AV Area (Vmean):   2.16 cm AV Area  (VTI):     2.40 cm AV Vmax:           134.00 cm/s AV Vmean:          91.300 cm/s AV VTI:            0.247 m AV Peak Grad:      7.2 mmHg AV Mean Grad:      4.0 mmHg LVOT Vmax:         93.30 cm/s LVOT Vmean:        62.900 cm/s LVOT VTI:          0.189 m LVOT/AV VTI ratio: 0.77  AORTA Ao Root diam: 2.20 cm MITRAL VALVE               TRICUSPID VALVE MV Area (PHT): 3.12 cm    TR Peak grad:   52.4 mmHg MV Decel Time: 243 msec    TR Vmax:  362.00 cm/s MV E velocity: 82.80 cm/s MV A velocity: 69.70 cm/s  SHUNTS MV E/A ratio:  1.19        Systemic VTI:  0.19 m                            Systemic Diam: 2.00 cm Kardie Tobb DO Electronically signed by Kardie Tobb DO Signature Date/Time: 02/05/2024/12:12:02 PM    Final         Scheduled Meds:  enoxaparin  (LOVENOX ) injection  40 mg Subcutaneous Daily   Continuous Infusions:   LOS: 0 days    Time spent: 55 minutes    Leatrice Chapel, MD  [facility] Pager #: [phone] 7PM-7AM contact night coverage as above

## 2024-02-06 NOTE — Progress Notes (Signed)
 MD made aware pt is asking for a new MD tomorrow.

## 2024-02-06 NOTE — Plan of Care (Signed)

## 2024-02-06 NOTE — Progress Notes (Signed)
 MD made aware of patient having a syncope episode in the bed this AM. CCMD called to make me aware patients HR was 170. Checked on patient seemed to be anxious and upset. She stated she had another episode. Vital signs were with in normal range. States  I feel light headed.

## 2024-02-07 DIAGNOSIS — R55 Syncope and collapse: Secondary | ICD-10-CM | POA: Diagnosis not present

## 2024-02-07 DIAGNOSIS — R519 Headache, unspecified: Secondary | ICD-10-CM | POA: Diagnosis not present

## 2024-02-07 LAB — CBC WITH DIFFERENTIAL/PLATELET
Abs Immature Granulocytes: 0.02 10*3/uL (ref 0.00–0.07)
Basophils Absolute: 0.1 10*3/uL (ref 0.0–0.1)
Basophils Relative: 1 %
Eosinophils Absolute: 0.1 10*3/uL (ref 0.0–0.5)
Eosinophils Relative: 1 %
HCT: 35.9 % — ABNORMAL LOW (ref 36.0–46.0)
Hemoglobin: 12.2 g/dL (ref 12.0–15.0)
Immature Granulocytes: 0 %
Lymphocytes Relative: 52 %
Lymphs Abs: 3.3 10*3/uL (ref 0.7–4.0)
MCH: 30.6 pg (ref 26.0–34.0)
MCHC: 34 g/dL (ref 30.0–36.0)
MCV: 90 fL (ref 80.0–100.0)
Monocytes Absolute: 0.6 10*3/uL (ref 0.1–1.0)
Monocytes Relative: 9 %
Neutro Abs: 2.5 10*3/uL (ref 1.7–7.7)
Neutrophils Relative %: 37 %
Platelets: 302 10*3/uL (ref 150–400)
RBC: 3.99 MIL/uL (ref 3.87–5.11)
RDW: 13.3 % (ref 11.5–15.5)
WBC: 6.6 10*3/uL (ref 4.0–10.5)
nRBC: 0 % (ref 0.0–0.2)

## 2024-02-07 LAB — RENAL FUNCTION PANEL
Albumin: 4 g/dL (ref 3.5–5.0)
Anion gap: 10 (ref 5–15)
BUN: 13 mg/dL (ref 6–20)
CO2: 22 mmol/L (ref 22–32)
Calcium: 9.5 mg/dL (ref 8.9–10.3)
Chloride: 103 mmol/L (ref 98–111)
Creatinine, Ser: 1.06 mg/dL — ABNORMAL HIGH (ref 0.44–1.00)
GFR, Estimated: >60 mL/min (ref >60)
Glucose, Bld: 82 mg/dL (ref 70–99)
Phosphorus: 4.7 mg/dL — ABNORMAL HIGH (ref 2.5–4.6)
Potassium: 3.9 mmol/L (ref 3.5–5.1)
Sodium: 135 mmol/L (ref 135–145)

## 2024-02-07 LAB — IRON AND TIBC
Iron: 78 ug/dL (ref 28–170)
Saturation Ratios: 25 % (ref 10.4–31.8)
TIBC: 315 ug/dL (ref 250–450)
UIBC: 237 ug/dL

## 2024-02-07 LAB — ZINC: Zinc: 72 ug/dL (ref 44–115)

## 2024-02-07 LAB — MAGNESIUM: Magnesium: 1.9 mg/dL (ref 1.7–2.4)

## 2024-02-07 MED ORDER — ACETAMINOPHEN 325 MG PO TABS
650.0000 mg | ORAL_TABLET | Freq: Four times a day (QID) | ORAL | Status: DC | PRN
  Administered 2024-02-07: 650 mg via ORAL
  Filled 2024-02-07: qty 2

## 2024-02-07 MED ORDER — VITAMIN D3 25 MCG PO TABS: 1000.0000 [IU] | ORAL_TABLET | Freq: Every day | ORAL | Status: AC

## 2024-02-07 NOTE — Hospital Course (Signed)
 17/F with history of autism, asthma, multiple allergies and intolerances, has been having intermittent nausea and GI problems for over a year, she reports that on Tuesday 2/4 she was at work diplomatic services operational officer), developed sudden onset nausea then dizziness lightheadedness, went to the bathroom, threw up after which she briefly lost consciousness, this happened couple more times in the bathroom.  Called her mom went home, went to her pediatrician's office yesterday, had another episode of nausea, retching followed by brief loss of consciousness.  No episodes of seizure-like activity, no bowel or bladder incontinence.  Denies EtOH use, admits to marijuana use occasionally, no longer on Ritalin or Wellbutrin as listed on med rec. -In the ER initially in sinus tachycardia, subsequently improved, labs only remarkable for mild hypokalemia, UDS positive for cannabis, TSH normal, D-dimer normal, urine pregnancy negative

## 2024-02-07 NOTE — Plan of Care (Signed)

## 2024-02-07 NOTE — Consult Note (Signed)
 [facility] Psychiatric Consult Initial  Patient Name: .Carolyn Pitts  MRN: [id number]  DOB: 26-Oct-2006  Consult Order details: Depression with somatic symptoms versus somatoformdisorder versus adjustemt disorder/? Symptoms likely supratentorial. Extensive psych history. Orders (From admission, onward)     Start     Ordered   02/06/24 1339  IP CONSULT TO PSYCHIATRY       Ordering Provider: Rosario Leatrice FERNS, MD  Provider:  (Not yet assigned)  Question Answer Comment  Location [facility]   Reason for Consult? Depression with somatic symptoms verus somatoform disorder versus adjustment disorder/?symptoms likely supratentorial. Extensive psych history      02/06/24 1340             Mode of Visit: In person    Psychiatry Consult Evaluation  Service Date: February 07, 2024 LOS:  LOS: 0 days  Chief Complaint I need answer to what I'm feeling  Primary Psychiatric Diagnoses  MDD 2.  Anxiety Disorder 3.  Autism Spetrum Disorder 4. ADHD 5. OCD Assessment  Carolyn Pitts is a 18 y.o. female admitted: Medicallyfor 02/04/2024  3:55 PM with complaint of chronic headaches, weakness, syncope for his near syncope episodes.  She carries the psychiatric diagnoses of MDD, Anxiety D/O, Autism Spectrum D/O, PTSD and has a past medical history of  Migraine, HA, and Asthma.   Her current presentation of worsening headache, N&V, and syncopal episodes is likely consistent with Psychogenic etiology. 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 She describes her symptoms as decrease heart rate, room spinning fast, everything stopping which sometimes lasts for 30 seconds following with confusion. No tongue biting, loss of bowel or bladder in any of the episodes. Patient also report history of 3 head injuries in the past, but without loss of consciousness (in [employer]).   Patient's  psychiatric history is dated back to about 10 years ago when she was breifly seen in outpatient after multiple lossess in the family. She lost 3 family members including her father within a period of 4 months. She has historically been treated with multiple antidepressant with fair responses. She has not been on any psychotropics in the past one and half years. Currently in therapy which she founds helpful.  On initial examination, patient presents calm, pleasant and open to conversation. She expresses concern about the syncopal episodes and is hoping to get medical answer to her symptoms. Patient and her mother her requesting for MRI to be done and they expressed dissatisfaction about the lack of detailed medical attention given to her. Patient advised to consider the treatment of co occuring psychiatric conditions, possibly with a low dose of SSRI  which may address her depression and anxiety, but she declined at this time. They both strongly believe that her symptoms are medical related because of her past multiple medical issues.   Although patient has some psychosocial issues she is dealing with, such as relational difficulty with her partner, school/ work demands, however she denies any acute psychiatric symptoms requiring  treatment at this time. She is not interested in medication and would like to continue seeing her therapist. Please see plan below for detailed recommendations.   Diagnoses:  Active Hospital problems: Principal Problem:   Syncope Active Problems:   Autism   Asthma   Hypokalemia    Plan   ## Psychiatric Medication Recommendations:  -- None at this time (To consider low dose SSRI to manage  co- occurring psychiatric condition but she declined) -- Medicine may consider EEG, MRI  ## Medical Decision Making Capacity: Not specifically addressed in this encounter  ## Further Work-up:  --TSH, B12, folate -- Most recent EKG on 02/05/24 had QtC of 388 -- Pertinent labwork  reviewed earlier this admission includes: Folate- WNL, Vit D- Low at 25.21, TSH- WNL.   ## Disposition:-- There are no psychiatric contraindications to discharge at this time  ## Behavioral / Environmental: -Patient would benefit from more frequent contact with medical team to delineate plan of care and allow for clarification questions, which will help alleviate anxiety regarding treatment. If possible, try to check back in with the pt in the afternoon.    ## Safety and Observation Level:  - Based on my clinical evaluation, I estimate the patient to be at Low risk of self harm in the current setting. - At this time, we recommend  routine. This decision is based on my review of the chart including patient's history and current presentation, interview of the patient, mental status examination, and consideration of suicide risk including evaluating suicidal ideation, plan, intent, suicidal or self-harm behaviors, risk factors, and protective factors. This judgment is based on our ability to directly address suicide risk, implement suicide prevention strategies, and develop a safety plan while the patient is in the clinical setting. Please contact our team if there is a concern that risk level has changed.  CSSR Risk Category:C-SSRS RISK CATEGORY: No Risk  Suicide Risk Assessment: Patient has following modifiable risk factors for suicide: under treated depression , which we are addressing by continuing with therapy. Patient has following non-modifiable or demographic risk factors for suicide: history of suicide attempt Patient has the following protective factors against suicide: Access to outpatient mental health care, Supportive family, Supportive friends, Pets in the home, and no history of NSSIB  Thank you for this consult request. Recommendations have been communicated to the primary team.  We will signoff at this time. You may re consult psychiatry as needed.   LAWERENCE GUT, NP        History of Present Illness  Relevant Aspects of [facility] Course:  Admitted on 02/04/2024 for SOB, CP and syncope. They endorsed history of depression, anxiety, PTSD and Autism.   Patient Report: Patient describes her presenting problem as I need answer to what I'm feeling. She reports that she started having nausea and vomiting and passed out 3 times while at work on Tuesday and two more episodes at home that same day. Patient states that she had migraine headache and then felt congested that day prior to getting the fainting episodes. She reports a  history of migraine headache but her recent episode felt different. She further describes her symptoms as decrease heart rate, room spinning fast, everything stopping which sometimes lasts for 30 seconds following with confusion. No tongue biting, loss of bowel or bladder in any of the episodes. Patient denies having any acute psychiatric needs at the moment, denying SI/HI/AVH. She reports weaning herself off all her psychotropic medications about a year and a half ago because it made her feel numb. She has since been in therapy which she found helpful.   Patient admits to difficulty sleeping, improved appetite. She admits to smoking marijuana daily at night but denies use of alcohol or other illicit drugs. She strongly believes that her symptoms has medical etiology and not psychiatric. She is not open to starting medication but  agrees to remain in therapy.  Psych ROS:  Depression: difficulty sleeping (4.5 hr a night), denies SI/HI, plan or intent to harm Anxiety:  Occasionally feels overwhelm, no panic attacks. Mania (lifetime and current): Denies Psychosis: (lifetime and current): Denies AVH  Collateral information:  Contacted Mother was at the bedside for collateral report. She reported that she witnessed her daughter have what appeared to be syncopal episode vs seizure on few occasions since Tuesday. She described the episode as  her word slows, eyes gets heavy, neck swells up and head tilt back while pressing down on her chest. This often last between 20- 30 secs. Mother reported that patient has suffered multiple medical issues growing up  Review of Systems  Constitutional: Negative.           HENT:         Head-Normocephalic  Respiratory: Negative.    Neurological:  Negative for dizziness, tingling, tremors, sensory change, speech change, seizures, loss of consciousness, weakness and headaches.  Psychiatric/Behavioral:  Positive for depression and substance abuse. Negative for hallucinations, memory loss and suicidal ideas. The patient has insomnia. The patient is not nervous/anxious.      Psychiatric and Social History  Psychiatric History:  Information collected from Patient, patient's mother, and chart review   Prev Dx/Sx: MDD, Anxiety, PTSD, Autism Current Psych Provider: Outpatient with a therapist Home Meds (current): Vitamins (Magnesium ,  Previous Med Trials: Prozac , Trazodone, Bupropion, Hydroxyzine , Lexapro, Sertraline, Methylphenidate Therapy: Currently in therapy  Prior Psych Hospitalization: Psychiatric hospitalization for suicide attempt at age 58. Intentional overdose of hydroxyzine , Prozac  and a few drops of bleach in 2020.  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  Substance History Alcohol: Denies  Type of alcohol Denies Last Drink None Number of drinks per day None History of alcohol withdrawal seizures None History of DT's None Tobacco: None Illicit drugs: Cannabinoid use daily at night   Prescription drug abuse: Denies Rehab hx:  None  Exam Findings  Physical Exam:  Vital Signs:  Temp:  [98.2 F (36.8 C)-98.8 F (37.1 C)] 98.8 F (37.1 C) (02/08 1147) Pulse Rate:  [63-88] 86 (02/08 1147) Resp:  [18-19] 19 (02/08 1147) BP: (100-116)/(63-74) 110/74 (02/08 1147) SpO2:  [98 %-100 %] 99 % (02/08 1147) Blood pressure 110/74, pulse 86, temperature 98.8 F (37.1 C), temperature source Oral, resp. rate 19, last menstrual period 01/19/2024, SpO2 99%. There is no height or weight on file to calculate BMI.  Physical Exam  Mental Status Exam: General Appearance: Well Groomed  Orientation:  Full (Time, Place, and Person)  Memory:  Immediate;   Good Recent;   Good Remote;   Good  Concentration:  Concentration: Good and Attention Span: Good  Recall:  Good  Attention  Good  Eye Contact:  Good  Speech:  Normal Rate, clear  Language:  Good  Volume:  Normal  Mood: Frustrated  Affect:  Appropriate and Congruent  Thought Process:  Coherent, Goal Directed, and Linear  Thought Content:  Logical, devoid of paranoia or delusions  Suicidal Thoughts:  No  Homicidal Thoughts:  No  Judgement:  Fair  Insight:  Fair  Psychomotor Activity:  Normal  Akathisia:  No  Fund of Knowledge:  Good  Assets:  Communication Skills Desire for Improvement Housing Leisure Time Social Support Talents/Skills Transportation  Cognition:  WNL  ADL's:  Intact  AIMS (if indicated):        Other History   These have been pulled in through the EMR, reviewed, and updated if appropriate.  Family History:  The patient's family history is not on file.  Medical History: Past Medical History:  Diagnosis Date   Anxiety    Asthma    Autism    Depression    Geographical tongue    PTSD (post-traumatic stress disorder)     Surgical History: History reviewed. No pertinent surgical history.   Medications:   Current Facility-Administered Medications:    acetaminophen  (TYLENOL ) tablet 650 mg, 650 mg, Oral, Q6H PRN, Cindy Garnette POUR, MD, 650 mg at 02/07/24 0935   albuterol  (PROVENTIL ) (2.5 MG/3ML) 0.083% nebulizer solution 2.5 mg, 2.5 mg, Inhalation, Q6H PRN, Franky Redia SAILOR, MD   calcium  carbonate (TUMS - dosed in mg elemental calcium ) chewable tablet 200 mg of elemental calcium , 1 tablet, Oral, BID PRN, Joseph, Preetha, MD, 200 mg of elemental calcium  at 02/05/24 1231   cholecalciferol  (VITAMIN D3) 25 MCG (1000 UNIT) tablet 1,000 Units, 1,000 Units, Oral, Daily, Rosario Eland I, MD, 1,000 Units at 02/07/24 9065   diphenhydrAMINE  (BENADRYL ) capsule 25 mg, 25 mg, Oral, Q8H PRN, Joseph, Preetha, MD, 25 mg at 02/05/24 1551   enoxaparin  (LOVENOX ) injection 40 mg, 40 mg, Subcutaneous, Daily, Franky Redia SAILOR, MD, 40 mg at 02/07/24 9065   meclizine  (ANTIVERT ) tablet 12.5 mg, 12.5 mg, Oral, TID PRN, Ogbata, Sylvester I, MD   ondansetron  (ZOFRAN ) injection 4 mg, 4 mg, Intravenous, Q6H PRN, Opyd, Timothy S, MD, 4 mg at 02/07/24 9265   vitamin B-12 (CYANOCOBALAMIN ) tablet 100 mcg, 100 mcg, Oral, Daily, Ogbata, Sylvester I, MD, 100 mcg at 02/07/24 9065  Allergies: Allergies  Allergen Reactions   Beef Allergy    Cheese    Milk (Cow)    Pork Allergy     Vessie Olmsted, NP

## 2024-02-07 NOTE — Discharge Summary (Signed)
 Physician Discharge Summary   Patient: Carolyn Pitts MRN: [id number] DOB: [birth year]  Admit date:     02/04/2024  Discharge date: 02/07/24  Discharge Physician: Garnette Pelt   PCP: [facility]   Recommendations at discharge:    Follow up with PCP in 1-2 weeks  Discharge Diagnoses: Principal Problem:   Syncope Active Problems:   Autism   Asthma   Hypokalemia  Resolved Problems:   * No resolved hospital problems. *  Hospital Course: No notes on file  Assessment and Plan: Recurrent syncope - likely vasovagal given overall symptomatology -Orthostatics were negative, TSH normal -EEG and 2D echo reviewed, neg -CT head reviewed, neg -Seen by Psychiatry who mentions possible psychogenic component -Pt did report bouts of palpitations. Recommend outpatient cardiac monitoring. Have sent Cardiology staff message regarding this -stable for d/c today   Hypokalemia Replaced   History of frequent nausea, multiple food intolerances and allergies -Would benefit from gastroenterology eval, allergy immunology and good nutritionist -Educated to avoid all processed food   History of tingling, numbness   History of asthma -No wheezing at this time   Depression -Stable, no longer on Wellbutrin, denies suicidal ideation    Consultants: Psychiatry Procedures performed: EEG  Disposition: Home Diet recommendation:  Regular diet DISCHARGE MEDICATION: Allergies as of 02/07/2024       Reactions   Beef Allergy    Cheese    Milk (cow)    Pork Allergy         Medication List     STOP taking these medications    famotidine  40 MG tablet Commonly known as: PEPCID    HYDROcodone -acetaminophen  5-325 MG tablet Commonly known as: NORCO/VICODIN   methylphenidate 18 MG CR tablet Commonly known as: CONCERTA   polyethylene glycol 17 g packet Commonly known as: MiraLax    traZODone 50 MG tablet Commonly known as: DESYREL       TAKE these medications     acetaminophen  500 MG tablet Commonly known as: TYLENOL  Take 500-1,000 mg by mouth every 6 (six) hours as needed for mild pain or headache.   albuterol  108 (90 Base) MCG/ACT inhaler Commonly known as: VENTOLIN  HFA Inhale 1-2 puffs into the lungs every 6 (six) hours as needed for wheezing or shortness of breath.   calcium  carbonate 500 MG chewable tablet Commonly known as: TUMS - dosed in mg elemental calcium  Chew 1-2 tablets by mouth as needed for indigestion or heartburn.   FLUoxetine  20 MG capsule Commonly known as: PROZAC  Take 1 capsule (20 mg total) by mouth at bedtime.   hydrOXYzine  10 MG tablet Commonly known as: ATARAX  Take 1 tablet (10 mg total) by mouth 3 (three) times daily as needed for anxiety.   ibuprofen  200 MG tablet Commonly known as: ADVIL  Take 400 mg by mouth every 6 (six) hours as needed for headache, mild pain or cramping.   Junel FE 1/20 1-20 MG-MCG tablet Generic drug: norethindrone-ethinyl estradiol-FE Take 1 tablet by mouth See admin instructions. Take 1 tablet by mouth at bedtime on non-active days of monthly cycle   Magnesium  Oxide -Mg Supplement 500 MG Tabs Take 1 tablet by mouth daily.   omeprazole 40 MG capsule Commonly known as: PRILOSEC Take 40 mg by mouth daily before breakfast.   ondansetron  4 MG tablet Commonly known as: ZOFRAN  Take 1 tablet (4 mg total) by mouth every 6 (six) hours.   One-A-Day Teen Advantage/Her Tabs Take 1 tablet by mouth daily with breakfast.   vitamin D3 25 MCG tablet Commonly known  as: CHOLECALCIFEROL  Take 1 tablet (1,000 Units total) by mouth daily. Start taking on: February 08, 2024        Follow-up Information     [facility] Follow up in 2 week(s).   Why: Hospital follow up Contact information: [address] [place] [address] [phone]                Discharge Exam: There were no vitals filed for this visit. General exam: Awake, laying in bed, in  nad Respiratory system: Normal respiratory effort, no wheezing Cardiovascular system: regular rate, s1, s2 Gastrointestinal system: Soft, nondistended, positive BS Central nervous system: CN2-12 grossly intact, strength intact Extremities: Perfused, no clubbing Skin: Normal skin turgor, no notable skin lesions seen Psychiatry: Mood normal // no visual hallucinations   Condition at discharge: fair  The results of significant diagnostics from this hospitalization (including imaging, microbiology, ancillary and laboratory) are listed below for reference.   Imaging Studies: EEG adult Result Date: 02/05/2024 Shelton Arlin KIDD, MD     02/05/2024 12:25 PM Patient Name: Carolyn Pitts MRN: [id number] Epilepsy Attending: Arlin KIDD Shelton Referring Physician/Provider: Franky Redia SAILOR, MD Date: 02/05/2024 Duration: 22.27 mins Patient history: 18yo F with 5 episodes of syncope. EEG to evaluate for seizure Level of alertness: Awake, drowsy AEDs during EEG study: None Technical aspects: This EEG study was done with scalp electrodes positioned according to the 10-20 International system of electrode placement. Electrical activity was reviewed with band pass filter of 1-70Hz , sensitivity of 7 uV/mm, display speed of 21mm/sec with a 60Hz  notched filter applied as appropriate. EEG data were recorded continuously and digitally stored.  Video monitoring was available and reviewed as appropriate. Description: The posterior dominant rhythm consists of 10 Hz activity of moderate voltage (25-35 uV) seen predominantly in posterior head regions, symmetric and reactive to eye opening and eye closing. Drowsiness was characterized by attenuation of the posterior background rhythm. Physiologic photic driving was not seen during photic stimulation.  Hyperventilation was not performed.   During photic stimulation, patient reported feeling confused and dizzy similar to how she feels before her vasovagal episode but intensity.   Concomitant EEG before, during and after the event did not show any EEG changes with a seizure. IMPRESSION: This study is within normal limits. No seizures or epileptiform discharges were seen throughout the recording. During photic stimulation,  patient reported feeling confused and dizzy similar to how she feels before her vasovagal episode but intensity without concomitant EEG change.  This was a nonepileptic event. A normal interictal EEG does not exclude nor support the diagnosis of epilepsy. Arlin KIDD Shelton   ECHOCARDIOGRAM COMPLETE Result Date: 02/05/2024    ECHOCARDIOGRAM REPORT   Patient Name:   Carolyn Pitts Date of Exam: 02/05/2024 Medical Rec #:  [id number]         Height:       63.0 in Accession #:    [id number]        Weight:       118.0 lb Date of Birth:  2006-12-17         BSA:          1.545 m Patient Age:    17 years          BP:           110/65 mmHg Patient Gender: F                 HR:  69 bpm. Exam Location:  Inpatient Procedure: 2D Echo, 3D Echo, Cardiac Doppler, Color Doppler and Strain Analysis Indications:    Syncope  Sonographer:    Ozell Free Referring Phys: [id number] ARSHAD N KAKRAKANDY IMPRESSIONS  1. Left ventricular ejection fraction, by estimation, is 55 to 60%. The left ventricle has normal function. The left ventricle has no regional wall motion abnormalities. Left ventricular diastolic parameters were normal. The average left ventricular global longitudinal strain is -20.7 %. The global longitudinal strain is normal.  2. Right ventricular systolic function is normal. The right ventricular size is normal. There is moderately elevated pulmonary artery systolic pressure.  3. The mitral valve is normal in structure. Trivial mitral valve regurgitation. No evidence of mitral stenosis.  4. The aortic valve is normal in structure. Aortic valve regurgitation is not visualized. No aortic stenosis is present.  5. The inferior vena cava is normal in size with greater than 50%  respiratory variability, suggesting right atrial pressure of 3 mmHg. FINDINGS  Left Ventricle: Left ventricular ejection fraction, by estimation, is 55 to 60%. The left ventricle has normal function. The left ventricle has no regional wall motion abnormalities. The average left ventricular global longitudinal strain is -20.7 %. The global longitudinal strain is normal. The left ventricular internal cavity size was normal in size. There is no left ventricular hypertrophy. Left ventricular diastolic parameters were normal. Right Ventricle: The right ventricular size is normal. No increase in right ventricular wall thickness. Right ventricular systolic function is normal. There is moderately elevated pulmonary artery systolic pressure. The tricuspid regurgitant velocity is 3.62 m/s, and with an assumed right atrial pressure of 3 mmHg, the estimated right ventricular systolic pressure is 55.4 mmHg. Left Atrium: Left atrial size was normal in size. Right Atrium: Right atrial size was normal in size. Pericardium: There is no evidence of pericardial effusion. Mitral Valve: The mitral valve is normal in structure. Trivial mitral valve regurgitation. No evidence of mitral valve stenosis. Tricuspid Valve: The tricuspid valve is normal in structure. Tricuspid valve regurgitation is mild . No evidence of tricuspid stenosis. Aortic Valve: The aortic valve is normal in structure. Aortic valve regurgitation is not visualized. No aortic stenosis is present. Aortic valve mean gradient measures 4.0 mmHg. Aortic valve peak gradient measures 7.2 mmHg. Aortic valve area, by VTI measures 2.40 cm. Pulmonic Valve: The pulmonic valve was normal in structure. Pulmonic valve regurgitation is not visualized. No evidence of pulmonic stenosis. Aorta: The aortic root is normal in size and structure. Venous: The inferior vena cava is normal in size with greater than 50% respiratory variability, suggesting right atrial pressure of 3 mmHg.  IAS/Shunts: No atrial level shunt detected by color flow Doppler.  LEFT VENTRICLE PLAX 2D LVIDd:         4.90 cm   Diastology LVIDs:         3.30 cm   LV e' medial:    14.30 cm/s LV PW:         0.70 cm   LV E/e' medial:  5.8 LV IVS:        0.80 cm   LV e' lateral:   21.50 cm/s LVOT diam:     2.00 cm   LV E/e' lateral: 3.9 LV SV:         59 LV SV Index:   38        2D Longitudinal Strain LVOT Area:     3.14 cm  2D Strain GLS Avg:     -20.7 %  3D Volume EF:                          3D EF:        54 %                          LV EDV:       119 ml                          LV ESV:       55 ml                          LV SV:        65 ml RIGHT VENTRICLE             IVC RV Basal diam:  3.30 cm     IVC diam: 1.60 cm RV S prime:     11.70 cm/s TAPSE (M-mode): 2.3 cm LEFT ATRIUM             Index        RIGHT ATRIUM           Index LA diam:        2.70 cm 1.75 cm/m   RA Area:     12.20 cm LA Vol (A2C):   30.1 ml 19.48 ml/m  RA Volume:   29.40 ml  19.03 ml/m LA Vol (A4C):   30.2 ml 19.54 ml/m LA Biplane Vol: 32.5 ml 21.03 ml/m  AORTIC VALVE AV Area (Vmax):    2.19 cm AV Area (Vmean):   2.16 cm AV Area (VTI):     2.40 cm AV Vmax:           134.00 cm/s AV Vmean:          91.300 cm/s AV VTI:            0.247 m AV Peak Grad:      7.2 mmHg AV Mean Grad:      4.0 mmHg LVOT Vmax:         93.30 cm/s LVOT Vmean:        62.900 cm/s LVOT VTI:          0.189 m LVOT/AV VTI ratio: 0.77  AORTA Ao Root diam: 2.20 cm MITRAL VALVE               TRICUSPID VALVE MV Area (PHT): 3.12 cm    TR Peak grad:   52.4 mmHg MV Decel Time: 243 msec    TR Vmax:        362.00 cm/s MV E velocity: 82.80 cm/s MV A velocity: 69.70 cm/s  SHUNTS MV E/A ratio:  1.19        Systemic VTI:  0.19 m                            Systemic Diam: 2.00 cm Kardie Tobb DO Electronically signed by Dub Huntsman DO Signature Date/Time: 02/05/2024/12:12:02 PM    Final    DG Chest 2 View Result Date: 02/04/2024 CLINICAL DATA:  Syncope. EXAM: CHEST - 2  VIEW COMPARISON:  February 06, 2023. FINDINGS: The heart size and mediastinal contours are within normal limits. Both lungs are clear. The visualized skeletal structures are unremarkable. IMPRESSION: No active cardiopulmonary disease. Electronically Signed  By: Lynwood Landy Raddle M.D.   On: 02/04/2024 17:55   CT Head Wo Contrast Result Date: 02/04/2024 CLINICAL DATA:  Transient ischemic attack (TIA).  Syncope. EXAM: CT HEAD WITHOUT CONTRAST TECHNIQUE: Contiguous axial images were obtained from the base of the skull through the vertex without intravenous contrast. RADIATION DOSE REDUCTION: This exam was performed according to the departmental dose-optimization program which includes automated exposure control, adjustment of the mA and/or kV according to patient size and/or use of iterative reconstruction technique. COMPARISON:  None Available. FINDINGS: Brain: There is no evidence of an acute infarct, intracranial hemorrhage, mass, midline shift, or extra-axial fluid collection. Cerebral volume is normal. The ventricles are normal in size. Vascular: No hyperdense vessel. Skull: No fracture or suspicious osseous lesion. Sinuses/Orbits: Visualized paranasal sinuses and mastoid air cells are clear. Unremarkable included orbits. Other: None. IMPRESSION: Negative head CT. Electronically Signed   By: Dasie Hamburg M.D.   On: 02/04/2024 17:01    Microbiology: Results for orders placed or performed during the hospital encounter of 02/04/24  Resp panel by RT-PCR (RSV, Flu A&B, Covid) Anterior Nasal Swab     Status: None   Collection Time: 02/04/24  5:10 PM   Specimen: Anterior Nasal Swab  Result Value Ref Range Status   SARS Coronavirus 2 by RT PCR NEGATIVE NEGATIVE Final    Comment: (NOTE) SARS-CoV-2 target nucleic acids are NOT DETECTED.  The SARS-CoV-2 RNA is generally detectable in upper respiratory specimens during the acute phase of infection. The lowest concentration of SARS-CoV-2 viral copies this assay  can detect is 138 copies/mL. A negative result does not preclude SARS-Cov-2 infection and should not be used as the sole basis for treatment or other patient management decisions. A negative result may occur with  improper specimen collection/handling, submission of specimen other than nasopharyngeal swab, presence of viral mutation(s) within the areas targeted by this assay, and inadequate number of viral copies(<138 copies/mL). A negative result must be combined with clinical observations, patient history, and epidemiological information. The expected result is Negative.  Fact Sheet for Patients:  bloggercourse.com  Fact Sheet for Healthcare Providers:  seriousbroker.it  This test is no t yet approved or cleared by the [place]  FDA and  has been authorized for detection and/or diagnosis of SARS-CoV-2 by FDA under an Emergency Use Authorization (EUA). This EUA will remain  in effect (meaning this test can be used) for the duration of the COVID-19 declaration under Section 564(b)(1) of the Act, 21 U.S.C.section 360bbb-3(b)(1), unless the authorization is terminated  or revoked sooner.       Influenza A by PCR NEGATIVE NEGATIVE Final   Influenza B by PCR NEGATIVE NEGATIVE Final    Comment: (NOTE) The Xpert Xpress SARS-CoV-2/FLU/RSV plus assay is intended as an aid in the diagnosis of influenza from Nasopharyngeal swab specimens and should not be used as a sole basis for treatment. Nasal washings and aspirates are unacceptable for Xpert Xpress SARS-CoV-2/FLU/RSV testing.  Fact Sheet for Patients: bloggercourse.com  Fact Sheet for Healthcare Providers: seriousbroker.it  This test is not yet approved or cleared by the [place]  FDA and has been authorized for detection and/or diagnosis of SARS-CoV-2 by FDA under an Emergency Use Authorization (EUA). This EUA will  remain in effect (meaning this test can be used) for the duration of the COVID-19 declaration under Section 564(b)(1) of the Act, 21 U.S.C. section 360bbb-3(b)(1), unless the authorization is terminated or revoked.     Resp Syncytial Virus by PCR NEGATIVE NEGATIVE Final  Comment: (NOTE) Fact Sheet for Patients: bloggercourse.com  Fact Sheet for Healthcare Providers: seriousbroker.it  This test is not yet approved or cleared by the [place]  FDA and has been authorized for detection and/or diagnosis of SARS-CoV-2 by FDA under an Emergency Use Authorization (EUA). This EUA will remain in effect (meaning this test can be used) for the duration of the COVID-19 declaration under Section 564(b)(1) of the Act, 21 U.S.C. section 360bbb-3(b)(1), unless the authorization is terminated or revoked.  Performed at [facility], [address] [place], [place] [address]     Labs: CBC: Recent Labs  Lab 02/04/24 1545 02/05/24 0707 02/07/24 0605  WBC 7.8 5.5 6.6  NEUTROABS 3.3 2.0 2.5  HGB 12.0 11.2* 12.2  HCT 36.1 33.2* 35.9*  MCV 91.9 91.5 90.0  PLT 332 273 302   Basic Metabolic Panel: Recent Labs  Lab 02/04/24 1545 02/05/24 0707 02/07/24 0605  NA 138 138 135  K 3.3* 4.4 3.9  CL 103 106 103  CO2 25 23 22   GLUCOSE 91 84 82  BUN 5* <5* 13  CREATININE 0.89 0.85 1.06*  CALCIUM  9.9 9.1 9.5  MG 2.1 2.1 1.9  PHOS  --   --  4.7*   Liver Function Tests: Recent Labs  Lab 02/04/24 1545 02/07/24 0605  AST 13*  --   ALT 11  --   ALKPHOS 55  --   BILITOT 0.8  --   PROT 8.0  --   ALBUMIN 4.6 4.0   CBG: No results for input(s): GLUCAP in the last 168 hours.  Discharge time spent: less than 30 minutes.  Signed: Garnette Pelt, MD [facility] 02/07/2024

## 2024-02-08 ENCOUNTER — Other Ambulatory Visit: Payer: Self-pay | Admitting: Physician Assistant

## 2024-02-08 DIAGNOSIS — R55 Syncope and collapse: Secondary | ICD-10-CM

## 2024-02-09 ENCOUNTER — Other Ambulatory Visit (INDEPENDENT_AMBULATORY_CARE_PROVIDER_SITE_OTHER): Payer: 59

## 2024-02-09 ENCOUNTER — Encounter: Payer: Self-pay | Admitting: *Deleted

## 2024-02-09 DIAGNOSIS — R55 Syncope and collapse: Secondary | ICD-10-CM

## 2024-02-09 NOTE — Progress Notes (Unsigned)
Enrolled for [employer] to mail a ZIO AT Live Telemetry monitor to patients address on file.   Letter with instructions mailed to patient.  Dr. Margaretann Loveless to read.

## 2024-02-17 ENCOUNTER — Telehealth: Payer: Self-pay | Admitting: Physician Assistant

## 2024-02-17 ENCOUNTER — Telehealth: Payer: Self-pay | Admitting: *Deleted

## 2024-02-17 NOTE — Telephone Encounter (Signed)
Called and made patient aware to continue to wear ZIO monitor as ordered. Patient verbalizes she has a follow up appointment with her PCP 02/18/24. Advise patient to continue to monitor  her syncope episode and call office for any other questions  or concerns. Patient verbalized an understanding.

## 2024-02-17 NOTE — Telephone Encounter (Signed)
Zio by Meredeth Ide is call to give abnormal results

## 2024-02-17 NOTE — Telephone Encounter (Signed)
Made DOD, Dr. Royann Shivers aware of abnormal rhythm strip. DOD signed strip and advise the patient to continue to monitor herself. Patient made aware to call office for any questions or concerns. Patient verbalized an understanding

## 2024-02-17 NOTE — Telephone Encounter (Signed)
Called and spoke to patient . Patient denies any distress or syncope episode at this time. Patient verbalizes she has had an syncopal episode that last 10 seconds everyday except Sunday. Zio technician., Jocelyn  called in with critical  ZIO results. Will route to DOD for advice. Made patient aware to call offices for any other episodes and to f/u with PCP as ordered. Patient verbalized an understanding.

## 2024-03-11 ENCOUNTER — Telehealth: Payer: Self-pay | Admitting: *Deleted

## 2024-03-11 DIAGNOSIS — R55 Syncope and collapse: Secondary | ICD-10-CM

## 2024-03-11 NOTE — Telephone Encounter (Signed)
 14 day Zio report containing critical alert taken to DOD (Dr. Antoine Poche), who reviewed strips and recommended that pt see EP.  Currently has OV scheduled with Dr. Jacques Navy on 05/11/2024.  Needs to see EP first. Will enter internal EP referral.

## 2024-03-18 ENCOUNTER — Encounter: Payer: Self-pay | Admitting: Cardiology

## 2024-03-18 ENCOUNTER — Ambulatory Visit: Attending: Cardiology | Admitting: Cardiology

## 2024-03-18 VITALS — BP 98/66 | HR 64 | Ht 63.0 in | Wt 119.4 lb

## 2024-03-18 DIAGNOSIS — G909 Disorder of the autonomic nervous system, unspecified: Secondary | ICD-10-CM

## 2024-03-18 DIAGNOSIS — R55 Syncope and collapse: Secondary | ICD-10-CM

## 2024-03-18 NOTE — Progress Notes (Signed)
  Electrophysiology Office Note:   Date:  03/18/2024  ID:  Carolyn Pitts, DOB Aug 26, 2006, MRN [id number]  Primary Cardiologist: None Primary Heart Failure: None Electrophysiologist: [name], MD      History of Present Illness:   Carolyn Pitts is a 18 y.o. female with h/o syncope, autism, asthma, hypokalemia seen today for  for Electrophysiology evaluation of syncope at the request of Angelina Sheriff.    She presents today with episodes of syncope or palpitations.  This has been occurring for quite a long time, many years.  She has syncopal episodes when she gets stressed.  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  This usually works to prevent these episodes.  She also has episodes of syncope that appear to come out of the blue without any warning.  She currently feels well and is without complaint.  She did wear a cardiac monitor, and did have palpitations while wearing the monitor.  She also felt dizzy and lightheaded.  This corresponds to sinus rhythm and sinus tachycardia.  She did have some heart block on the monitor, but did not have symptoms during this time.  She had a narrow QRS complex at that time.  Review of systems complete and found to be negative unless listed in HPI.   EP Information / Studies Reviewed:    EKG is not ordered today. EKG from 02/05/2024 reviewed which showed sinus rhythm        Risk Assessment/Calculations:              Physical Exam:   VS:  BP 98/66   Pulse 64   Ht 5\' 3"  (1.6 m)   Wt 119 lb 6.4 oz (54.2 kg)   SpO2 99%   BMI 21.15 kg/m    Wt Readings from Last 3 Encounters:  03/18/24 119 lb 6.4 oz (54.2 kg) (38%, Z= -0.30)*  02/06/23 118 lb (53.5 kg) (41%, Z= -0.24)*  12/02/21 143 lb 11.8 oz (65.2 kg) (83%, Z= 0.95)*   * Growth percentiles are based on CDC (Girls, 2-20 Years) data.     GEN: Well nourished, well developed in no acute  distress NECK: No JVD; No carotid bruits CARDIAC: Regular rate and rhythm, no murmurs, rubs, gallops RESPIRATORY:  Clear to auscultation without rales, wheezing or rhonchi  ABDOMEN: Soft, non-tender, non-distended EXTREMITIES:  No edema; No deformity   ASSESSMENT AND PLAN:    Syncope: Has had episodes of syncope that sound more vagal.  She does have warning signs and can lay down.  She has other episodes of syncope, without major rhythm abnormalities on her cardiac monitor.  She did have sinus rhythm and sinus tachycardia, but heart rates are not out of the ordinary for an 18 year old.  For now, we [name] continue to monitor.  Potential autonomic dysfunction: Patient has significant palpitations and rapid heart rates.  These do occur when she is not exercising.  I have told her to increase her fluid to 3 g/day and sodium to 8 to 12 g/day.  I have told her to continue exercising and given her information on potential exercise regimen.  [name] see her back in 3 months for further evaluation.  Follow up with EP APP in 3 months  Signed, Henrique Parekh [name], MD

## 2024-03-18 NOTE — Patient Instructions (Signed)
 Medication Instructions:  Your physician recommends that you continue on your current medications as directed. Please refer to the Current Medication list given to you today.  *If you need a refill on your cardiac medications before your next appointment, please call your pharmacy*   Lab Work: None ordered   Testing/Procedures: None ordered   Follow-Up: At [facility], you and your health needs are our priority.  As part of our continuing mission to provide you with exceptional heart care, we have created designated Provider Care Teams.  These Care Teams include your primary Cardiologist (physician) and Advanced Practice Providers (APPs -  Physician Assistants and Nurse Practitioners) who all work together to provide you with the care you need, when you need it.  We recommend signing up for the patient portal called "MyChart".  Sign up information is provided on this After Visit Summary.  MyChart is used to connect with patients for Virtual Visits (Telemedicine).  Patients are able to view lab/test results, encounter notes, upcoming appointments, etc.  Non-urgent messages can be sent to your provider as well.   To learn more about what you can do with MyChart, go to [email or web address].    Your next appointment:   3 month(s)  The format for your next appointment:   In Person  Provider:   You will see one of the following Advanced Practice Providers on your designated Care Team:   Francis Dowse, Charlott Holler [address]" [place], [place] Sherie Don, NP Canary Brim, NP    Thank you for choosing [facility]!!   Dory Horn, RN [phone]  Other Instructions  Postural Orthostatic Tachycardia Syndrome Postural orthostatic tachycardia syndrome (POTS) is a group of symptoms that occur along with an increase in heart rate when a person stands up after lying down. The symptoms include light-headedness or fainting, and they improve when the person lies back down. POTS may be  associated with another medical condition, or it may occur on its own. What are the causes? The cause of this condition is not known, but many conditions and diseases are associated with it. What increases the risk? This condition is more likely to develop in: Women 15-34 years old. Women who are pregnant. Women who are in their period (menstruating). People who have certain conditions, such as: Infection from a virus. Diseases that cause the body's defense system (immune system) to attack healthy organs. These are called autoimmune diseases. Losing a lot of red blood cells (anemia). Losing too much water in the body (dehydration). An overactive thyroid (hyperthyroidism). People who take certain medicines. People who have had a major injury. People who have had surgery. What are the signs or symptoms? The most common symptom of this condition is light-headedness when you stand up from a lying or sitting position. Other symptoms may include: Feeling a rapid increase in the heartbeat (tachycardia) within 10 minutes of standing up. Chest pain. Shortness of breath. Breathing that is deeper and faster than normal (hyperventilation). Fainting. Confusion. Trembling. Weakness. Headache. Anxiety. Nausea. Sweating or flushing. Symptoms may be worse in the morning, and they may be relieved by lying down. How is this diagnosed? This condition is diagnosed based on: Your symptoms. Your medical history. A physical exam. Checking your heart rate when you are lying down and after you stand up. Checking your blood pressure when you go from lying down to standing up. Blood and urine tests to measure hormones that change with blood pressure. The blood tests will be done when you are lying  down and when you are standing up. You may have other tests to check for conditions or diseases that are associated with POTS. How is this treated? Treatment for this condition depends on how severe your  symptoms are and whether you have any conditions or diseases that are associated with POTS. Treatment may involve: Treating any conditions or diseases that are associated with POTS. Drinking two glasses of water before getting up from a lying position. Increasing salt (sodium) in your diet. Taking medicine to control blood pressure and heart rate (beta-blocker). Avoiding certain medicines. Starting an exercise program under the supervision of a health care provider. Follow these instructions at home: Medicines Take over-the-counter and prescription medicines only as told by your health care provider. Let your health care provider know about all prescription or over-the-counter medicines you take. These include herbs, vitamins, and supplements. You may need to stop or adjust some medicines if they cause this condition. Talk with your health care provider before starting any new medicines. Eating and drinking  Drink enough fluid to keep your urine pale yellow. If told by your health care provider, drink two glasses of water before getting up from a lying position. Follow instructions from your health care provider about how much sodium you should include in your diet. Avoid heavy meals. Eat several small meals a day instead of a few large meals. General instructions Do an aerobic exercise for 20 minutes a day, at least 3 days a week. Aerobic exercises are those that cause your heart to beat faster. Ask your health care provider what kinds of exercise are safe for you. Do not use any products that contain nicotine or tobacco. These products include cigarettes, chewing tobacco, and vaping devices, such as e-cigarettes. These can interfere with blood flow. If you need help quitting, ask your health care provider. Keep all follow-up visits. This is important. Contact a health care provider if: Your symptoms do not improve after treatment. Your symptoms get worse. You develop new symptoms. Get  help right away if: You have chest pain. You have difficulty breathing. You have fainting episodes. These symptoms may be an emergency. Get help right away. Call 911. Do not wait to see if the symptoms will go away. Do not drive yourself to the hospital. Summary POTS is a group of symptoms that occur along with an increase in heart rate when a person stands up after lying down. The most common symptom is light-headedness when you stand up. Treatment for this condition includes treating any underlying conditions, drinking plenty of water, stopping or changing some medicines, or starting an exercise program. Get help right away if you have chest pain, difficulty breathing, or fainting episodes. These symptoms may be an emergency. This information is not intended to replace advice given to you by your health care provider. Make sure you discuss any questions you have with your health care provider. Document Revised: 06/28/2021 Document Reviewed: 06/28/2021 Elsevier Patient Education  2024 [employer].

## 2024-03-23 ENCOUNTER — Ambulatory Visit: Admitting: Cardiology

## 2024-04-15 DIAGNOSIS — R55 Syncope and collapse: Secondary | ICD-10-CM | POA: Diagnosis not present

## 2024-04-15 NOTE — Progress Notes (Signed)
 Dr. Lawana Pray, please review the heart monitor result. This formal monitor result took awhile to come back, but you have already seen in the patient in March, and based on your note, sounds like you already reviewed this monitor report. Does this formal report change your assessment and plan in March?

## 2024-05-11 ENCOUNTER — Ambulatory Visit: Payer: 59 | Attending: Internal Medicine | Admitting: Internal Medicine

## 2024-05-11 ENCOUNTER — Encounter: Payer: Self-pay | Admitting: Internal Medicine

## 2024-05-11 VITALS — BP 118/70 | HR 95 | Ht 63.0 in | Wt 114.0 lb

## 2024-05-11 DIAGNOSIS — R002 Palpitations: Secondary | ICD-10-CM | POA: Diagnosis not present

## 2024-05-11 DIAGNOSIS — G909 Disorder of the autonomic nervous system, unspecified: Secondary | ICD-10-CM | POA: Diagnosis not present

## 2024-05-11 DIAGNOSIS — E876 Hypokalemia: Secondary | ICD-10-CM | POA: Diagnosis not present

## 2024-05-11 DIAGNOSIS — J45909 Unspecified asthma, uncomplicated: Secondary | ICD-10-CM

## 2024-05-11 DIAGNOSIS — F840 Autistic disorder: Secondary | ICD-10-CM

## 2024-05-11 DIAGNOSIS — R55 Syncope and collapse: Secondary | ICD-10-CM | POA: Diagnosis not present

## 2024-05-11 NOTE — Patient Instructions (Signed)
 Medication Instructions:  No Changes *If you need a refill on your cardiac medications before your next appointment, please call your pharmacy*  Lab Work: None  Testing/Procedures: Your physician has requested that you have an exercise tolerance test. For further information please visit [email or web address] Please also follow instruction sheet, as given.   [employer] Cardiac Event Monitor Instructions  Your physician has requested you wear your cardiac event monitor for 30 days. [employer] may call or text to confirm a shipping address. The monitor will be sent to a land address via UPS. [employer] will not ship a monitor to a PO BOX. It typically takes 3-5 days to receive your monitor after it has been enrolled. [employer] will assist with USPS tracking if your package is delayed. The telephone number for [employer] is [phone]. Once you have received your monitor, please review the enclosed instructions. Instruction tutorials can also be viewed under help and settings on the enclosed cell phone. Your monitor has already been registered assigning a specific monitor serial # to you.  Billing and Self Pay Discount Information  [employer] has been provided the insurance information we had on file for you.  If your insurance has been updated, please call [employer] at [phone] to provide them with your updated insurance information.   [employer] offers a discounted Self Pay option for patients who have insurance that does not cover their cardiac event monitor or patients without insurance.  The discounted cost of a Self Pay Cardiac Event Monitor would be $225.00 , if the patient contacts [employer] at [phone] within 7 days of applying the monitor to make payment arrangements.  If the patient does not contact [employer] within 7 days of applying the monitor, the cost of the cardiac event monitor will be $350.00.  Applying the monitor  Remove cell phone from case and turn it  on. The cell phone works as IT consultant and needs to be within [employer] of you at all times. The cell phone will need to be charged on a daily basis. We recommend you plug the cell phone into the enclosed charger at your bedside table every night.  Monitor batteries: You will receive two monitor batteries labelled #1 and #2. These are your recorders. Plug battery #2 onto the second connection on the enclosed charger. Keep one battery on the charger at all times. This will keep the monitor battery deactivated. It will also keep it fully charged for when you need to switch your monitor batteries. A small light will be blinking on the battery emblem when it is charging. The light on the battery emblem will remain on when the battery is fully charged.  Open package of a Monitor strip. Insert battery #1 into black hood on strip and gently squeeze monitor battery onto connection as indicated in instruction booklet. Set aside while preparing skin.  Choose location for your strip, vertical or horizontal, as indicated in the instruction booklet. Shave to remove all hair from location. There cannot be any lotions, oils, powders, or colognes on skin where monitor is to be applied. Wipe skin clean with enclosed Saline wipe. Dry skin completely.  Peel paper labeled #1 off the back of the Monitor strip exposing the adhesive. Place the monitor on the chest in the vertical or horizontal position shown in the instruction booklet. One arrow on the monitor strip must be pointing upward. Carefully remove paper labeled #2, attaching remainder of strip to your skin. Try not to create any folds or wrinkles in the strip as  you apply it.  Firmly press and release the circle in the center of the monitor battery. You will hear a small beep. This is turning the monitor battery on. The heart emblem on the monitor battery will light up every 5 seconds if the monitor battery in turned on and connected to the patient  securely. Do not push and hold the circle down as this turns the monitor battery off. The cell phone will locate the monitor battery. A screen will appear on the cell phone checking the connection of your monitor strip. This may read poor connection initially but change to good connection within the next minute. Once your monitor accepts the connection you will hear a series of 3 beeps followed by a climbing crescendo of beeps. A screen will appear on the cell phone showing the two monitor strip placement options. Touch the picture that demonstrates where you applied the monitor strip.  Your monitor strip and battery are waterproof. You are able to shower, bathe, or swim with the monitor on. They just ask you do not submerge deeper than 3 feet underwater. We recommend removing the monitor if you are swimming in a lake, river, or ocean.  Your monitor battery will need to be switched to a fully charged monitor battery approximately once a week. The cell phone will alert you of an action which needs to be made.  On the cell phone, tap for details to reveal connection status, monitor battery status, and cell phone battery status. The green dots indicates your monitor is in good status. A red dot indicates there is something that needs your attention.  To record a symptom, click the circle on the monitor battery. In 30-60 seconds a list of symptoms will appear on the cell phone. Select your symptom and tap save. Your monitor will record a sustained or significant arrhythmia regardless of you clicking the button. Some patients do not feel the heart rhythm irregularities. [employer] will notify us  of any serious or critical events.  Refer to instruction booklet for instructions on switching batteries, changing strips, the Do not disturb or Pause features, or any additional questions.  Call [employer] at [phone], to confirm your monitor is transmitting and record your baseline. They  will answer any questions you may have regarding the monitor instructions at that time.  Returning the monitor to [employer]  Place all equipment back into blue box. Peel off strip of paper to expose adhesive and close box securely. There is a prepaid UPS shipping label on this box. Drop in a UPS drop box, or at a UPS facility like [employer]. You may also contact [employer] to arrange UPS to pick up monitor package at your home.   Follow-Up: At [facility], you and your health needs are our priority.  As part of our continuing mission to provide you with exceptional heart care, our providers are all part of one team.  This team includes your primary Cardiologist (physician) and Advanced Practice Providers or APPs (Physician Assistants and Nurse Practitioners) who all work together to provide you with the care you need, when you need it.  Your next appointment:   3 month(s)  Provider:   Gayatri A Acharya, MD or Hao Meng, PA or Callie Goodrich, [place]    Other Instructions Please call us  or send a MyChart message with any Cardiology related questions/concerns.  [phone].  Thank you!

## 2024-05-11 NOTE — Progress Notes (Addendum)
 Cardiology Office Note:  .   Date:  05/11/2024  ID:  Carolyn Pitts, DOB [birth year], MRN [id number] PCP: [facility]  [facility] Providers Cardiologist:  None Electrophysiologist:  Will Cortland Ding, MD    History of Present Illness: .   Carolyn Pitts is a 18 y.o. female.  Discussed the use of AI scribe software for clinical note transcription with the patient, who gave verbal consent to proceed.  History of Present Illness Carolyn Pitts is an 18 year old female who presents with episodes of fainting and heart rate abnormalities. She is accompanied by her mother. She was referred to EP for evaluation of heart rate abnormalities and fainting episodes.  She experiences fainting episodes since earlier this year, often preceded by heart rate spikes of 170 to 190 bpm, even at rest or with minimal activity. These episodes increase in frequency before her menstrual period and stabilize once it begins. An electrophysiologist previously noted an abnormality on her heart monitor. Abnormal heart sensations resumed immediately after the monitor's removal, and patient feels that the monitor did not capture her true episodes well.  She has an athletic background, having played soccer and worked as a Hydrographic surveyor. Fainting occurs under various conditions, including standing quickly, walking fast, or lying in bed. She finds relief by performing handstands, attributing this to improved cerebral blood flow.  Her family history includes significant health issues: her father has FSGS and multiple kidney transplants, her mother had heart issues during pregnancy, and maternal relatives have a history of heart attacks and tachycardia.    ROS: negative except per HPI above.  Studies Reviewed: .        Results DIAGNOSTIC Heart monitor: Atrioventricular dissociation Echocardiography: Normal EKG: Normal Risk Assessment/Calculations:   {Does this patient have ATRIAL  FIBRILLATION?:[id number]}   Physical Exam:   VS:  BP 118/70   Pulse 95   Ht 5\' 3"  (1.6 m)   Wt 114 lb (51.7 kg)   SpO2 100%   BMI 20.19 kg/m    Wt Readings from Last 3 Encounters:  05/11/24 114 lb (51.7 kg) (26%, Z= -0.64)*  03/18/24 119 lb 6.4 oz (54.2 kg) (38%, Z= -0.30)*  02/06/23 118 lb (53.5 kg) (41%, Z= -0.24)*   * Growth percentiles are based on CDC (Girls, 2-20 Years) data.     Physical Exam GENERAL: Alert, cooperative, well developed, no acute distress. HEENT: Normocephalic, normal oropharynx, moist mucous membranes. CHEST: Clear to auscultation bilaterally, no wheezes, rhonchi, or crackles. CARDIOVASCULAR: Normal heart rate and rhythm, S1 and S2 normal without murmurs, EKG normal. ABDOMEN: Soft, non-tender, non-distended, without organomegaly, normal bowel sounds. EXTREMITIES: No cyanosis or edema. NEUROLOGICAL: Cranial nerves grossly intact, moves all extremities without gross motor or sensory deficit.   ASSESSMENT AND PLAN: .    Assessment and Plan Assessment & Plan Fainting spells with tachycardia Episodes of syncope with tachycardia (170-190 bpm) exacerbated premenstrually. Possible exaggerated sympathetic response. Differential includes POTS, vagus nerve involvement, genetic factors. Echocardiogram and EKG normal. Family history of tachycardia. Discussed POTS and compression socks. Syncope poses risks during activities. - Order 30-day ambulatory heart monitor, focus on menstruation period. - Refer to electrophysiologist for second opinion. - Advise medium to high compression stockings. - Continue increased sodium and fluid intake. - Encourage light physical activity. - Avoid new supplements. - Follow-up in three months post-electrophysiologist consultation.  Asthma Asthma likely exercise or temperature-induced.  Eczema Eczema noted without active issues.  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  Allergy serology  pending.  Autism Autism noted. Pressure and squeeze provide comfort, aiding acceptance of compression stockings.      Grady Lawman, MD, FACC

## 2024-05-25 ENCOUNTER — Telehealth (HOSPITAL_COMMUNITY): Payer: Self-pay

## 2024-05-25 NOTE — Telephone Encounter (Signed)
 Pt stated she would be here for her test. S.Carolyn Pitts CCT

## 2024-05-26 ENCOUNTER — Encounter: Payer: Self-pay | Admitting: Neurology

## 2024-05-26 ENCOUNTER — Ambulatory Visit: Payer: 59 | Admitting: Neurology

## 2024-05-26 VITALS — BP 107/70 | HR 81 | Ht 63.0 in | Wt 115.2 lb

## 2024-05-26 DIAGNOSIS — R197 Diarrhea, unspecified: Secondary | ICD-10-CM

## 2024-05-26 DIAGNOSIS — E538 Deficiency of other specified B group vitamins: Secondary | ICD-10-CM

## 2024-05-26 DIAGNOSIS — R7309 Other abnormal glucose: Secondary | ICD-10-CM | POA: Diagnosis not present

## 2024-05-26 DIAGNOSIS — D509 Iron deficiency anemia, unspecified: Secondary | ICD-10-CM

## 2024-05-26 DIAGNOSIS — R768 Other specified abnormal immunological findings in serum: Secondary | ICD-10-CM

## 2024-05-26 DIAGNOSIS — Z79899 Other long term (current) drug therapy: Secondary | ICD-10-CM

## 2024-05-26 DIAGNOSIS — E52 Niacin deficiency [pellagra]: Secondary | ICD-10-CM

## 2024-05-26 DIAGNOSIS — R5383 Other fatigue: Secondary | ICD-10-CM

## 2024-05-26 DIAGNOSIS — F39 Unspecified mood [affective] disorder: Secondary | ICD-10-CM

## 2024-05-26 DIAGNOSIS — Z91014 Allergy to mammalian meats: Secondary | ICD-10-CM

## 2024-05-26 DIAGNOSIS — M779 Enthesopathy, unspecified: Secondary | ICD-10-CM

## 2024-05-26 DIAGNOSIS — D532 Scorbutic anemia: Secondary | ICD-10-CM

## 2024-05-26 DIAGNOSIS — M790 Rheumatism, unspecified: Secondary | ICD-10-CM | POA: Diagnosis not present

## 2024-05-26 DIAGNOSIS — R21 Rash and other nonspecific skin eruption: Secondary | ICD-10-CM | POA: Diagnosis not present

## 2024-05-26 DIAGNOSIS — D8940 Mast cell activation, unspecified: Secondary | ICD-10-CM

## 2024-05-26 DIAGNOSIS — R55 Syncope and collapse: Secondary | ICD-10-CM | POA: Diagnosis not present

## 2024-05-26 DIAGNOSIS — R569 Unspecified convulsions: Secondary | ICD-10-CM

## 2024-05-26 DIAGNOSIS — R202 Paresthesia of skin: Secondary | ICD-10-CM

## 2024-05-26 DIAGNOSIS — R609 Edema, unspecified: Secondary | ICD-10-CM

## 2024-05-26 DIAGNOSIS — E519 Thiamine deficiency, unspecified: Secondary | ICD-10-CM

## 2024-05-26 DIAGNOSIS — E530 Riboflavin deficiency: Secondary | ICD-10-CM

## 2024-05-26 DIAGNOSIS — E559 Vitamin D deficiency, unspecified: Secondary | ICD-10-CM

## 2024-05-26 DIAGNOSIS — R7689 Other specified abnormal immunological findings in serum: Secondary | ICD-10-CM

## 2024-05-26 DIAGNOSIS — W57XXXA Bitten or stung by nonvenomous insect and other nonvenomous arthropods, initial encounter: Secondary | ICD-10-CM

## 2024-05-26 NOTE — Progress Notes (Signed)
 [facility]    Provider:  Dr Tresia Fruit Requesting Provider: Arvilla Birmingham, MD Primary Care Provider:  [facility]  CC:  Syncope  HPI:  Carolyn Pitts is a 18 y.o. female here as requested by Arvilla Birmingham, MD for migraines, syncopal episodes. has MDD (major depressive disorder), recurrent severe, without psychosis (HCC); PTSD (post-traumatic stress disorder); OCD (obsessive compulsive disorder); Other specified anxiety disorders; Autism; Transient LOC (loss of consciousness); Asthma; Syncope; and Hypokalemia on their problem list.  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 Psychiatry mentioned possible psychogenic component. History of frequent nausea. She had an EEG but it was just a routine. Happens a lot more the week before her period. She feel dizzy and then pass out, more lightheaded pruor to episodes, she has time to sit down and lay down and passes out and comes back. She puts herself in a safe position and lays down. She has lost many people her dad, her aunt a lot of tragic. Spoke with mother over the phone. Discussed hypotension.   Reviewed notes, labs and imaging from outside physicians, which showed:  Reviewed images agree with findings   Narrative & Impression  CLINICAL DATA:  Transient ischemic attack (TIA).  Syncope.   EXAM: CT HEAD WITHOUT CONTRAST   TECHNIQUE: Contiguous axial images were  obtained from the base of the skull through the vertex without intravenous contrast.   RADIATION DOSE REDUCTION: This exam was performed according to the departmental dose-optimization program which includes automated exposure control, adjustment of the mA and/or kV according to patient size and/or use of iterative reconstruction technique.   COMPARISON:  None Available.   FINDINGS: Brain: There is no evidence of an acute infarct, intracranial hemorrhage, mass, midline shift, or extra-axial fluid collection. Cerebral volume is normal. The ventricles are normal in size.   Vascular: No hyperdense vessel.   Skull: No fracture or suspicious osseous lesion.   Sinuses/Orbits: Visualized paranasal sinuses and mastoid air cells are clear. Unremarkable included orbits.   Other: None.   IMPRESSION: Negative head CT.    Review of Systems: Patient complains of symptoms per HPI as well as the following symptoms none. Pertinent negatives and positives per HPI. All others negative.   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, one Medical laboratory scientific officer   Social Drivers of Corporate investment banker Strain: Not on file  Food Insecurity: Low Risk  (02/25/2024)   Received from [facility]   Hunger Vital Sign    Worried About Running Out of Food in the Last Year: Never true    Ran Out of Food in the Last Year: Never true  Recent Concern: Food Insecurity - Medium  Risk (02/04/2024)   Received from [facility]   Hunger Vital Sign    Worried About Running Out of Food in the Last Year: Sometimes true    Ran Out of Food in the Last Year: Sometimes true  Transportation Needs: No Transportation Needs (02/25/2024)   Received from [facility]    In the past 12 months, has lack of reliable transportation kept you from medical appointments, meetings, work or from getting things needed for daily living? : No  Physical Activity: Not on file  Stress: Not on file  Social Connections: Not on file  Intimate Partner Violence: Not At Risk (02/05/2024)   Humiliation, Afraid, Rape, and Kick questionnaire    Fear of Current or Ex-Partner: No    Emotionally Abused: No    Physically Abused: No    Sexually Abused: No    Family History  Problem Relation Age of Onset   Migraines Mother    Migraines Father    Migraines Brother    Diabetes Maternal Grandmother    Cancer Paternal Grandfather     Past Medical History:  Diagnosis Date   Anxiety    Asthma    Autism    Depression    Geographical tongue    PTSD (post-traumatic stress disorder)     Patient Active Problem List   Diagnosis Date Noted   Asthma 02/05/2024   Syncope 02/05/2024   Hypokalemia 02/05/2024   Transient LOC (loss of consciousness) 02/04/2024   Autism 02/06/2023   PTSD (post-traumatic stress disorder) 08/28/2019   OCD (obsessive compulsive disorder) 08/28/2019   Other specified anxiety disorders 08/28/2019   MDD (major depressive disorder), recurrent severe, without psychosis (HCC) 08/27/2019    History reviewed. No pertinent surgical history.  Current Outpatient Medications  Medication Sig Dispense Refill   acetaminophen  (TYLENOL ) 500 MG tablet Take 500-1,000 mg by mouth every 6 (six) hours as needed for mild pain or headache.     albuterol  (VENTOLIN  HFA) 108 (90 Base) MCG/ACT inhaler Inhale 1-2 puffs into the lungs every 6 (six) hours as needed for wheezing or  shortness of breath.     calcium  carbonate (TUMS - DOSED IN MG ELEMENTAL CALCIUM ) 500 MG chewable tablet Chew 1-2 tablets by mouth as needed for indigestion or heartburn.     cholecalciferol  (CHOLECALCIFEROL ) 25 MCG tablet Take 1 tablet (1,000 Units total) by mouth daily.     ibuprofen  (ADVIL ) 200 MG tablet Take 400 mg by mouth every 6 (six) hours as needed for headache, mild pain (pain score 1-3) or cramping.     Magnesium  Oxide -Mg Supplement 500 MG TABS Take 1 tablet by mouth daily.     Multiple Vitamins-Minerals (ONE-A-DAY TEEN ADVANTAGE/HER) TABS Take 1 tablet by mouth daily with breakfast.     ondansetron  (ZOFRAN ) 4 MG tablet Take 1 tablet (4 mg total) by mouth every 6 (six) hours. 12 tablet 0   No current facility-administered medications for this visit.    Allergies as of 05/26/2024 - Review Complete 05/26/2024  Allergen  Reaction Noted   Beef allergy  02/05/2024   Cheese  02/05/2024   Milk (cow)  02/05/2024   Pork allergy  02/05/2024    Vitals: BP 107/70 (Cuff Size: Normal)   Pulse 81   Ht 5\' 3"  (1.6 m)   Wt 115 lb 3.2 oz (52.3 kg)   BMI 20.41 kg/m  Last Weight:  Wt Readings from Last 1 Encounters:  05/26/24 115 lb 3.2 oz (52.3 kg) (28%, Z= -0.57)*   * Growth percentiles are based on CDC (Girls, 2-20 Years) data.   Last Height:   Ht Readings from Last 1 Encounters:  05/26/24 5\' 3"  (1.6 m) (31%, Z= -0.49)*   * Growth percentiles are based on CDC (Girls, 2-20 Years) data.     Physical exam: Exam: Gen: NAD, conversant, well nourised, well groomed                     CV: RRR, no MRG. No Carotid Bruits. No peripheral edema, warm, nontender Eyes: Conjunctivae clear without exudates or hemorrhage  Neuro: Detailed Neurologic Exam  Speech:    Speech is normal; fluent and spontaneous with normal comprehension.  Cognition:    The patient is oriented to person, place, and time;     recent and remote memory intact;     language fluent;     normal attention,  concentration,     fund of knowledge Cranial Nerves:    The pupils are equal, round, and reactive to light. The fundi are normal and spontaneous venous pulsations are present. Visual fields are full to finger confrontation. Extraocular movements are intact. Trigeminal sensation is intact and the muscles of mastication are normal. The face is symmetric. The palate elevates in the midline. Hearing intact. Voice is normal. Shoulder shrug is normal. The tongue has normal motion without fasciculations.   Coordination:    Normal finger to nose and heel to shin. Normal rapid alternating movements.   Gait:    Heel-toe and tandem gait are normal.   Motor Observation:    No asymmetry, no atrophy, and no involuntary movements noted. Tone:    Normal muscle tone.    Posture:    Posture is normal. normal erect    Strength:    Strength is V/V in the upper and lower limbs.      Sensation: intact to LT     Reflex Exam:  DTR's:    Deep tendon reflexes in the upper and lower extremities are normal bilaterally.   Toes:    The toes are downgoing bilaterally.   Clonus:    Clonus is absent.    Assessment/Plan:  Patient with multiple neurologic complaints/symptoms including The primary encounter diagnosis was Syncope, unspecified syncope type. Diagnoses of screen for Rheumatic disease, Rash, Elevated glucose, screen for B12 deficiency, screen for Vitamin B1 deficiency, screen for Vitamin D  deficiency, Paresthesias, High risk medication use, screen for Iron deficiency anemia, unspecified iron deficiency anemia type, Diarrhea, unspecified type, Tick bite, unspecified site, initial encounter, screen for Alpha-gal syndrome(patient reports being possibly diagnosed), screen for ANA positive, screen for Vitamin C deficiency anemia, Other fatigue, Swelling, Inflammation around joint, screen for Vitamin B5 deficiency, screen for Vitamin B3 deficiency, screen for Vitamin B2 deficiency, Mast cell activation (HCC),  Seizure-like activity (HCC), and Mood disorder (HCC) were also pertinent to this visit.    MRI of the brain to evaluate for seizure focus due to syncopal episodes may be convulsive syncope or vasovagal more likely, 72-hour ambulatory eeg  Referral to [facility]   MRI w/w contrast seizure protocol 72 hour EEG ambulatory Midodrine or Florinef trial? Blood work [facility]   1. Syncope, unspecified syncope type (Primary) - CBC with Differential/Platelets - Comprehensive metabolic panel with GFR - ANA Comprehensive Panel - ANA, IFA (with reflex) - Sjogren's syndrome antibods(ssa + ssb) - Angiotensin converting enzyme - RPR - Cortisol-am, blood - ANCA Profile - Magnesium  - Sedimentation rate - C-reactive protein - MR BRAIN W WO CONTRAST; Future  2. screen for Rheumatic disease - CBC with Differential/Platelets - Comprehensive metabolic panel with GFR - ANA Comprehensive Panel - ANA, IFA (with reflex) - Sjogren's syndrome antibods(ssa + ssb) - Angiotensin converting enzyme - RPR - Rheumatoid factor - ANCA Profile - Sedimentation rate - C-reactive protein  3. Rash - CBC with Differential/Platelets - Comprehensive metabolic panel with GFR - ANA Comprehensive Panel - ANA, IFA (with reflex) - Sjogren's syndrome antibods(ssa + ssb) - Angiotensin converting enzyme - RPR - Rheumatoid factor - ANCA Profile - Sedimentation rate - C-reactive protein - MR BRAIN W WO CONTRAST; Future  4. Elevated glucose - CBC with Differential/Platelets - Comprehensive metabolic panel with GFR - Hemoglobin A1c; Future  5. screen for B12 deficiency - B12 and Folate Panel - Methylmalonic acid, serum  6. screen for Vitamin B1 deficiency - Vitamin B1  7. screen for Vitamin D  deficiency - Vitamin D , 25-hydroxy  8. Paresthesias - B12 and Folate Panel - Methylmalonic acid, serum - Vitamin B6 - CBC with Differential/Platelets - Comprehensive metabolic panel with GFR - ANA Comprehensive  Panel - ANA, IFA (with reflex) - Sjogren's syndrome antibods(ssa + ssb) - Angiotensin converting enzyme - RPR - Hepatitis C antibody - Rheumatoid factor - Multiple Myeloma Panel (SPEP&IFE w/QIG) - Copper , serum - Parathyroid  hormone, intact (no Ca) - Cortisol-am, blood - Sedimentation rate - C-reactive protein - MR BRAIN W WO CONTRAST; Future  9. High risk medication use - Iron, TIBC and Ferritin Panel - B12 and Folate Panel - Methylmalonic acid, serum - Vitamin B1 - Vitamin B6 - CBC with Differential/Platelets - Comprehensive metabolic panel with GFR - ANA Comprehensive Panel - ANA, IFA (with reflex) - Sjogren's syndrome antibods(ssa + ssb) - Angiotensin converting enzyme - RPR - Hepatitis C antibody - Tissue transglutaminase, IgA - Gliadin antibodies, serum - Rheumatoid factor - Heavy metals, blood - Multiple Myeloma Panel (SPEP&IFE w/QIG) - Copper , serum - Parathyroid  hormone, intact (no Ca) - Cortisol-am, blood - Hemoglobin A1c; Future - Vitamin D , 25-hydroxy - ANCA Profile - Magnesium  - Alpha-Gal Panel - Vitamin C - TSH Rfx on Abnormal to Free T4 - Sedimentation rate - C-reactive protein - Vitamin B2, Whole Blood - Vitamin B3 - Vitamin B5 - Lyme Disease Serology w/Reflex - Tryptase - MR BRAIN W WO CONTRAST; Future - Ambulatory referral to Psychiatry  10. screen for Iron deficiency anemia, unspecified iron deficiency anemia type - Iron, TIBC and Ferritin Panel - CBC with Differential/Platelets - Comprehensive metabolic panel with GFR - ANA Comprehensive Panel - ANA, IFA (with reflex) - Sjogren's syndrome antibods(ssa + ssb) - Angiotensin converting enzyme  11. Diarrhea, unspecified type - B12 and Folate Panel - Methylmalonic acid, serum - CBC with Differential/Platelets - Comprehensive metabolic panel with GFR - Tissue transglutaminase, IgA - Gliadin antibodies, serum  12. Tick bite, unspecified site, initial encounter - CBC with  Differential/Platelets - Comprehensive metabolic panel with GFR - Lyme Disease Serology w/Reflex  13. screen for Alpha-gal syndrome(patient reports being possibly diagnosed)  14. screen for ANA  positive - ANA Comprehensive Panel - ANA, IFA (with reflex) - Sjogren's syndrome antibods(ssa + ssb) - Angiotensin converting enzyme - Rheumatoid factor - ANCA Profile - Magnesium   15. screen for Vitamin C deficiency anemia - Vitamin C  16. Other fatigue - Iron, TIBC and Ferritin Panel - B12 and Folate Panel - Methylmalonic acid, serum - Vitamin B1 - Vitamin B6 - CBC with Differential/Platelets - Comprehensive metabolic panel with GFR - ANA Comprehensive Panel - ANA, IFA (with reflex) - Sjogren's syndrome antibods(ssa + ssb) - Angiotensin converting enzyme - RPR - Hepatitis C antibody - Tissue transglutaminase, IgA - Gliadin antibodies, serum - Rheumatoid factor - Heavy metals, blood - Multiple Myeloma Panel (SPEP&IFE w/QIG) - Copper , serum - Parathyroid  hormone, intact (no Ca) - Cortisol-am, blood - Hemoglobin A1c; Future - Vitamin D , 25-hydroxy - ANCA Profile - Magnesium  - Alpha-Gal Panel - Vitamin C - TSH Rfx on Abnormal to Free T4 - Sedimentation rate - C-reactive protein - Vitamin B2, Whole Blood - Vitamin B3 - Vitamin B5 - Lyme Disease Serology w/Reflex - Tryptase - MR BRAIN W WO CONTRAST; Future - Ambulatory referral to Psychiatry  17. Swelling - CBC with Differential/Platelets - Comprehensive metabolic panel with GFR - RPR - Rheumatoid factor - Multiple Myeloma Panel (SPEP&IFE w/QIG) - Copper , serum - Parathyroid  hormone, intact (no Ca) - Cortisol-am, blood - ANCA Profile - Magnesium  - Sedimentation rate - C-reactive protein - Lyme Disease Serology w/Reflex - MR BRAIN W WO CONTRAST; Future  18. Inflammation around joint - B12 and Folate Panel - Methylmalonic acid, serum - Vitamin B1 - Vitamin B6 - CBC with Differential/Platelets -  Comprehensive metabolic panel with GFR - ANA Comprehensive Panel - ANA, IFA (with reflex) - Sjogren's syndrome antibods(ssa + ssb) - Angiotensin converting enzyme - RPR - Hepatitis C antibody - Rheumatoid factor - Heavy metals, blood - Multiple Myeloma Panel (SPEP&IFE w/QIG) - Copper , serum - Vitamin D , 25-hydroxy - ANCA Profile - Magnesium  - Sedimentation rate - C-reactive protein  19. screen for Vitamin B5 deficiency - Vitamin B5 - Lyme Disease Serology w/Reflex  20. screen for Vitamin B3 deficiency - Vitamin B3 - Vitamin B5 - Lyme Disease Serology w/Reflex  21. screen for Vitamin B2 deficiency - Vitamin B2, Whole Blood - Lyme Disease Serology w/Reflex  22. Mast cell activation (HCC) - Hemoglobin A1c; Future - ANCA Profile - Magnesium  - Alpha-Gal Panel - Vitamin C - TSH Rfx on Abnormal to Free T4 - Sedimentation rate - C-reactive protein - Tryptase  23. Seizure-like activity (HCC) - B12 and Folate Panel - Methylmalonic acid, serum - Vitamin B1 - Vitamin B6 - CBC with Differential/Platelets - Comprehensive metabolic panel with GFR - ANA Comprehensive Panel - ANA, IFA (with reflex) - Sjogren's syndrome antibods(ssa + ssb) - Angiotensin converting enzyme - RPR - Gliadin antibodies, serum - Rheumatoid factor - Heavy metals, blood - Cortisol-am, blood - Alpha-Gal Panel - Vitamin C - MR BRAIN W WO CONTRAST; Future  24. Mood disorder (HCC) - Iron, TIBC and Ferritin Panel - B12 and Folate Panel - Methylmalonic acid, serum - Vitamin B1 - Vitamin B6 - CBC with Differential/Platelets - Comprehensive metabolic panel with GFR - ANA Comprehensive Panel - ANA, IFA (with reflex) - Sjogren's syndrome antibods(ssa + ssb) - Angiotensin converting enzyme - RPR - Hepatitis C antibody - Tissue transglutaminase, IgA - Gliadin antibodies, serum - Rheumatoid factor - Heavy metals, blood - Multiple Myeloma Panel (SPEP&IFE w/QIG) - Copper , serum - Parathyroid   hormone, intact (no Ca) - Cortisol-am,  blood - Hemoglobin A1c; Future - Vitamin D , 25-hydroxy - ANCA Profile - Magnesium  - Alpha-Gal Panel - Vitamin C - TSH Rfx on Abnormal to Free T4 - Sedimentation rate - C-reactive protein - Vitamin B2, Whole Blood - Vitamin B3 - Vitamin B5 - Lyme Disease Serology w/Reflex - Tryptase - MR BRAIN W WO CONTRAST; Future - Ambulatory referral to Psychiatry   Will check a large battery of blood work and testing. Discussed with mother as well. Prolonged encounter unclear diagnosis.  Per [place]  DMV statutes, patients with seizures are not allowed to drive until they have been seizure-free for six months.    Use caution when using heavy equipment or power tools. Avoid working on ladders or at heights. Take showers instead of baths. Ensure the water temperature is not too high on the home water heater. Do not go swimming alone. Do not lock yourself in a room alone (i.e. bathroom). When caring for infants or small children, sit down when holding, feeding, or changing them to minimize risk of injury to the child in the event you have a seizure. Maintain good sleep hygiene. Avoid alcohol.    If patient has another seizure, call 911 and bring them back to the ED if: A.  The seizure lasts longer than 5 minutes.      B.  The patient doesn't wake shortly after the seizure or has new problems such as difficulty seeing, speaking or moving following the seizure C.  The patient was injured during the seizure D.  The patient has a temperature over 102 F (39C) E.  The patient vomited during the seizure and now is having trouble breathing  Per [place]  DMV statutes, patients with seizures are not allowed to drive until they have been seizure-free for six months.  Other recommendations include using caution when using heavy equipment or power tools. Avoid working on ladders or at heights. Take showers instead of baths.  Do not swim alone.  Ensure the water  temperature is not too high on the home water heater. Do not go swimming alone. Do not lock yourself in a room alone (i.e. bathroom). When caring for infants or small children, sit down when holding, feeding, or changing them to minimize risk of injury to the child in the event you have a seizure. Maintain good sleep hygiene. Avoid alcohol.  Also recommend adequate sleep, hydration, good diet and minimize stress.  Orders Placed This Encounter  Procedures   MR BRAIN W WO CONTRAST   Iron, TIBC and Ferritin Panel   B12 and Folate Panel   Methylmalonic acid, serum   Vitamin B1   Vitamin B6   CBC with Differential/Platelets   Comprehensive metabolic panel with GFR   ANA Comprehensive Panel   ANA, IFA (with reflex)   Sjogren's syndrome antibods(ssa + ssb)   Angiotensin converting enzyme   RPR   Hepatitis C antibody   Tissue transglutaminase, IgA   Gliadin antibodies, serum   Rheumatoid factor   Heavy metals, blood   Multiple Myeloma Panel (SPEP&IFE w/QIG)   Copper , serum   Parathyroid  hormone, intact (no Ca)   Cortisol-am, blood   Hemoglobin A1c   Vitamin D , 25-hydroxy   ANCA Profile   Magnesium    Alpha-Gal Panel   Vitamin C   TSH Rfx on Abnormal to Free T4   Sedimentation rate   C-reactive protein   Vitamin B2, Whole Blood   Vitamin B3   Vitamin B5   Lyme Disease Serology w/Reflex  Tryptase   Ambulatory referral to Psychiatry   No orders of the defined types were placed in this encounter.   Cc: Arvilla Birmingham, MD,  [facility]  Aldona Amel, MD  [facility] [address] [place], [place] [address]  Phone [phone] Fax [phone]  I spent over 120 minutes of face-to-face and non-face-to-face time with patient on the  1. Syncope, unspecified syncope type   2. screen for Rheumatic disease   3. Rash   4. Elevated glucose   5. screen for B12 deficiency   6. screen for Vitamin B1 deficiency   7. screen for  Vitamin D  deficiency   8. Paresthesias   9. High risk medication use   10. screen for Iron deficiency anemia, unspecified iron deficiency anemia type   11. Diarrhea, unspecified type   12. Tick bite, unspecified site, initial encounter   13. screen for Alpha-gal syndrome(patient reports being possibly diagnosed)   14. screen for ANA positive   15. screen for Vitamin C deficiency anemia   16. Other fatigue   17. Swelling   18. Inflammation around joint   19. screen for Vitamin B5 deficiency   20. screen for Vitamin B3 deficiency   21. screen for Vitamin B2 deficiency   22. Mast cell activation (HCC)   23. Seizure-like activity (HCC)   24. Mood disorder (HCC)    diagnosis.  This included previsit chart review, lab review, study review, order entry, electronic health record documentation, patient education on the different diagnostic and therapeutic options, counseling and coordination of care, risks and benefits of management, compliance, or risk factor reduction

## 2024-05-26 NOTE — Patient Instructions (Addendum)
 MRI w/w contrast seizure protocol 72 hour EEG ambulatory Midodrine or Florinef trial? Blood work Noble Bateman    Per [place]  [place] statutes, patients with seizures are not allowed to drive until they have been seizure-free for six months.    Use caution when using heavy equipment or power tools. Avoid working on ladders or at heights. Take showers instead of baths. Ensure the water temperature is not too high on the home water heater. Do not go swimming alone. Do not lock yourself in a room alone (i.e. bathroom). When caring for infants or small children, sit down when holding, feeding, or changing them to minimize risk of injury to the child in the event you have a seizure. Maintain good sleep hygiene. Avoid alcohol.    If patient has another seizure, call 911 and bring them back to the ED if: A.  The seizure lasts longer than 5 minutes.      B.  The patient doesn't wake shortly after the seizure or has new problems such as difficulty seeing, speaking or moving following the seizure C.  The patient was injured during the seizure D.  The patient has a temperature over 102 F (39C) E.  The patient vomited during the seizure and now is having trouble breathing  Per [place]  DM[place] statutes, patients with seizures are not allowed to drive until they have been seizure-free for six months.  Other recommendations include using caution when using heavy equipment or power tools. Avoid working on ladders or at heights. Take showers instead of baths.  Do not swim alone.  Ensure the water temperature is not too high on the home water heater. Do not go swimming alone. Do not lock yourself in a room alone (i.e. bathroom). When caring for infants or small children, sit down when holding, feeding, or changing them to minimize risk of injury to the child in the event you have a seizure. Maintain good sleep hygiene. Avoid alcohol.  Also recommend adequate sleep, hydration, good diet and minimize  stress.  During the Seizure  - First, ensure adequate ventilation and place patients on the floor on their left side  Loosen clothing around the neck and ensure the airway is patent. If the patient is clenching the teeth, do not force the mouth open with any object as this can cause severe damage - Remove all items from the surrounding that can be hazardous. The patient may be oblivious to what's happening and may not even know what he or she is doing. If the patient is confused and wandering, either gently guide him/her away and block access to outside areas - Reassure the individual and be comforting - Call 911. In most cases, the seizure ends before EMS arrives. However, there are cases when seizures may last over 3 to 5 minutes. Or the individual may have developed breathing difficulties or severe injuries. If a pregnant patient or a person with diabetes develops a seizure, it is prudent to call an ambulance. - Finally, if the patient does not regain full consciousness, then call EMS. Most patients will remain confused for about 45 to 90 minutes after a seizure, so you must use judgment in calling for help. - Avoid restraints but make sure the patient is in a bed with padded side rails - Place the individual in a lateral position with the neck slightly flexed; this will help the saliva drain from the mouth and prevent the tongue from falling backward - Remove all nearby furniture and other  hazards from the area - Provide verbal assurance as the individual is regaining consciousness - Provide the patient with privacy if possible - Call for help and start treatment as ordered by the caregiver   fter the Seizure (Postictal Stage)  After a seizure, most patients experience confusion, fatigue, muscle pain and/or a headache. Thus, one should permit the individual to sleep. For the next few days, reassurance is essential. Being calm and helping reorient the person is also of importance.  Most  seizures are painless and end spontaneously. Seizures are not harmful to others but can lead to complications such as stress on the lungs, brain and the heart. Individuals with prior lung problems may develop labored breathing and respiratory distress.

## 2024-05-27 ENCOUNTER — Telehealth: Payer: Self-pay | Admitting: *Deleted

## 2024-05-27 NOTE — Telephone Encounter (Signed)
-----   Message from Carolyn Pitts sent at 05/27/2024  1:09 PM EDT ----- Regarding: 72-hour eeg Can you please fill out paperwork for 72-hour eeg for patient for altered mental status/loss of consciousnes thank you, I can sign it when ready can also do it early next week thank you

## 2024-05-27 NOTE — Telephone Encounter (Signed)
 [facility] form completed. To be signed, placed in in box.

## 2024-05-28 ENCOUNTER — Ambulatory Visit (HOSPITAL_COMMUNITY)
Admission: RE | Admit: 2024-05-28 | Discharge: 2024-05-28 | Disposition: A | Discharge status: Home / Self Care | Source: Ambulatory Visit | Attending: Cardiology | Admitting: Cardiology

## 2024-05-28 DIAGNOSIS — R55 Syncope and collapse: Secondary | ICD-10-CM | POA: Diagnosis present

## 2024-05-28 DIAGNOSIS — R002 Palpitations: Secondary | ICD-10-CM | POA: Diagnosis present

## 2024-05-28 LAB — TRYPTASE: Tryptase: 4.1 ug/L (ref 2.2–13.2)

## 2024-05-28 LAB — EXERCISE TOLERANCE TEST
Angina Index: 0
Base ST Depression (mm): 0 mm
Duke Treadmill Score: 10
Estimated workload: 11.7
Exercise duration (min): 10 min
Exercise duration (sec): 0 s
MPHR: 202 {beats}/min
Peak HR: 190 {beats}/min
Percent HR: 94 %
Rest HR: 109 {beats}/min
ST Depression (mm): 0 mm

## 2024-05-31 ENCOUNTER — Ambulatory Visit: Payer: Self-pay | Admitting: Internal Medicine

## 2024-05-31 ENCOUNTER — Telehealth: Payer: Self-pay | Admitting: Neurology

## 2024-05-31 ENCOUNTER — Ambulatory Visit: Payer: Self-pay | Admitting: Neurology

## 2024-05-31 NOTE — Telephone Encounter (Signed)
 Referral for psychiatry fax to [facility]. Phone: [phone], Fax: [phone]

## 2024-06-01 ENCOUNTER — Telehealth: Payer: Self-pay | Admitting: Neurology

## 2024-06-01 NOTE — Telephone Encounter (Signed)
 She is scheduled to come to [facility] for her MRI on June 18, she asked for xanax to take before please.

## 2024-06-01 NOTE — Telephone Encounter (Signed)
 Please review and advise.

## 2024-06-03 LAB — ALPHA-GAL PANEL: IgE (Immunoglobulin E), Serum: 228 [IU]/mL (ref 6–495)

## 2024-06-03 LAB — CBC WITH DIFFERENTIAL/PLATELET
Basophils Absolute: 0.1 10*3/uL (ref 0.0–0.2)
Basos: 1 %
EOS (ABSOLUTE): 0.1 10*3/uL (ref 0.0–0.4)
Eos: 1 %
Hematocrit: 36 % (ref 34.0–46.6)
Hemoglobin: 11.3 g/dL (ref 11.1–15.9)
Immature Grans (Abs): 0 10*3/uL (ref 0.0–0.1)
Immature Granulocytes: 0 %
Lymphocytes Absolute: 3.3 10*3/uL — ABNORMAL HIGH (ref 0.7–3.1)
Lymphs: 45 %
MCH: 30 pg (ref 26.6–33.0)
MCHC: 31.4 g/dL — ABNORMAL LOW (ref 31.5–35.7)
MCV: 96 fL (ref 79–97)
Monocytes Absolute: 0.5 10*3/uL (ref 0.1–0.9)
Monocytes: 7 %
Neutrophils Absolute: 3.3 10*3/uL (ref 1.4–7.0)
Neutrophils: 46 %
Platelets: 354 10*3/uL (ref 150–450)
RBC: 3.77 x10E6/uL (ref 3.77–5.28)
RDW: 12.5 % (ref 11.7–15.4)
WBC: 7.2 10*3/uL (ref 3.4–10.8)

## 2024-06-03 LAB — VITAMIN B2, WHOLE BLOOD: Vitamin B2, Whole Blood: 193 ug/L (ref 137–370)

## 2024-06-03 LAB — IRON,TIBC AND FERRITIN PANEL
Ferritin: 56 ng/mL (ref 15–77)
Iron Saturation: 30 % (ref 15–55)
Iron: 77 ug/dL (ref 27–159)
Total Iron Binding Capacity: 258 ug/dL (ref 250–450)
UIBC: 181 ug/dL (ref 131–425)

## 2024-06-03 LAB — MULTIPLE MYELOMA PANEL, SERUM
Albumin SerPl Elph-Mcnc: 4.1 g/dL (ref 2.9–4.4)
Albumin/Glob SerPl: 1.3 (ref 0.7–1.7)
Alpha 1: 0.2 g/dL (ref 0.0–0.4)
Alpha2 Glob SerPl Elph-Mcnc: 0.7 g/dL (ref 0.4–1.0)
B-Globulin SerPl Elph-Mcnc: 1.1 g/dL (ref 0.7–1.3)
Gamma Glob SerPl Elph-Mcnc: 1.4 g/dL (ref 0.4–1.8)
Globulin, Total: 3.3 g/dL (ref 2.2–3.9)
IgA/Immunoglobulin A, Serum: 452 mg/dL — ABNORMAL HIGH (ref 87–352)
IgG (Immunoglobin G), Serum: 1414 mg/dL (ref 719–1475)
IgM (Immunoglobulin M), Srm: 192 mg/dL (ref 58–230)

## 2024-06-03 LAB — VITAMIN B3: Nicotinamide: 9.5 ng/mL (ref 5.2–72.1)

## 2024-06-03 LAB — LYME DISEASE SEROLOGY W/REFLEX: Lyme Total Antibody EIA: NEGATIVE

## 2024-06-03 LAB — GLIADIN ANTIBODIES, SERUM
Antigliadin Abs, IgA: 8 U (ref 0–19)
Gliadin IgG: 3 U (ref 0–19)

## 2024-06-03 LAB — COMPREHENSIVE METABOLIC PANEL WITH GFR
ALT: 10 IU/L (ref 0–32)
AST: 14 IU/L (ref 0–40)
Albumin: 4.6 g/dL (ref 4.0–5.0)
Alkaline Phosphatase: 66 IU/L (ref 42–106)
BUN/Creatinine Ratio: 13 (ref 9–23)
BUN: 10 mg/dL (ref 6–20)
Bilirubin Total: 0.3 mg/dL (ref 0.0–1.2)
CO2: 21 mmol/L (ref 20–29)
Calcium: 9.6 mg/dL (ref 8.7–10.2)
Chloride: 103 mmol/L (ref 96–106)
Creatinine, Ser: 0.8 mg/dL (ref 0.57–1.00)
Globulin, Total: 2.8 g/dL (ref 1.5–4.5)
Glucose: 78 mg/dL (ref 70–99)
Potassium: 3.8 mmol/L (ref 3.5–5.2)
Sodium: 140 mmol/L (ref 134–144)
Total Protein: 7.4 g/dL (ref 6.0–8.5)
eGFR: 109 mL/min/{1.73_m2} (ref >59)

## 2024-06-03 LAB — B12 AND FOLATE PANEL
Folate: 9.4 ng/mL (ref >3.0)
Vitamin B-12: 510 pg/mL (ref 232–1245)

## 2024-06-03 LAB — HEAVY METALS, BLOOD
Arsenic: 1 ug/L (ref 0–9)
Lead, Blood: <1 ug/dL (ref 0.0–3.4)
Mercury: <1 ug/L (ref 0.0–14.9)

## 2024-06-03 LAB — C-REACTIVE PROTEIN: CRP: <1 mg/L (ref 0–10)

## 2024-06-03 LAB — METHYLMALONIC ACID, SERUM: Methylmalonic Acid: 112 nmol/L (ref 0–378)

## 2024-06-03 LAB — ANCA PROFILE
Anti-MPO Antibodies: <0.2 U (ref 0.0–0.9)
Anti-PR3 Antibodies: <0.2 U (ref 0.0–0.9)
Atypical pANCA: <1:20 {titer}
C-ANCA: <1:20 {titer}
P-ANCA: <1:20 {titer}

## 2024-06-03 LAB — SEDIMENTATION RATE: Sed Rate: 2 mm/h (ref 0–32)

## 2024-06-03 LAB — ANA COMPREHENSIVE PANEL
Anti JO-1: <0.2 AI (ref 0.0–0.9)
Centromere Ab Screen: <0.2 AI (ref 0.0–0.9)
Chromatin Ab SerPl-aCnc: <0.2 AI (ref 0.0–0.9)
ENA RNP Ab: <0.2 AI (ref 0.0–0.9)
ENA SM Ab Ser-aCnc: <0.2 AI (ref 0.0–0.9)
ENA SSA (RO) Ab: <0.2 AI (ref 0.0–0.9)
ENA SSB (LA) Ab: <0.2 AI (ref 0.0–0.9)
Scleroderma (Scl-70) (ENA) Antibody, IgG: <0.2 AI (ref 0.0–0.9)
dsDNA Ab: 2 [IU]/mL (ref 0–9)

## 2024-06-03 LAB — VITAMIN C: Vitamin C: 1.1 mg/dL (ref 0.4–2.0)

## 2024-06-03 LAB — TISSUE TRANSGLUTAMINASE, IGA

## 2024-06-03 LAB — VITAMIN D 25 HYDROXY (VIT D DEFICIENCY, FRACTURES): Vit D, 25-Hydroxy: 29.1 ng/mL — ABNORMAL LOW (ref 30.0–100.0)

## 2024-06-03 LAB — ANGIOTENSIN CONVERTING ENZYME: Angio Convert Enzyme: 27 U/L (ref 14–82)

## 2024-06-03 LAB — ANTINUCLEAR ANTIBODIES, IFA

## 2024-06-03 LAB — RHEUMATOID FACTOR: Rheumatoid fact SerPl-aCnc: 24.4 [IU]/mL — ABNORMAL HIGH (ref <14.0)

## 2024-06-03 LAB — VITAMIN B6: Vitamin B6: 33.3 ug/L (ref 3.4–65.2)

## 2024-06-03 LAB — RPR: RPR Ser Ql: NONREACTIVE

## 2024-06-03 LAB — PARATHYROID HORMONE, INTACT (NO CA): PTH: 22 pg/mL (ref 15–65)

## 2024-06-03 LAB — VITAMIN B1: Thiamine: 104.4 nmol/L (ref 66.5–200.0)

## 2024-06-03 LAB — HEPATITIS C ANTIBODY: Hep C Virus Ab: NONREACTIVE

## 2024-06-03 LAB — VITAMIN B5: Vitamin B5: 57.9 ng/mL (ref 12.9–253.1)

## 2024-06-03 LAB — CORTISOL-AM, BLOOD: Cortisol - AM: 7.5 ug/dL (ref 6.2–19.4)

## 2024-06-03 LAB — COPPER, SERUM: Copper: 71 ug/dL (ref 71–146)

## 2024-06-03 LAB — TSH RFX ON ABNORMAL TO FREE T4: TSH: 1.27 u[IU]/mL (ref 0.450–4.500)

## 2024-06-03 LAB — MAGNESIUM: Magnesium: 2.1 mg/dL (ref 1.6–2.3)

## 2024-06-04 NOTE — Progress Notes (Signed)
 Gali, your vitamin D  is a little low I would supplement with an over the counter Vitamin D3. Otherwise nothing came back concerning. The slight increase in rheumatoid factor is not concerning when all the other labs are negative/normal(even normal people often have increase in RF), even the minor abnormalities on the other labs are not ocncerning. This is great news I hope it makes your weekend, have a great one thanks

## 2024-06-06 ENCOUNTER — Other Ambulatory Visit: Payer: Self-pay | Admitting: Neurology

## 2024-06-06 MED ORDER — ALPRAZOLAM 0.25 MG PO TABS: ORAL_TABLET | ORAL | 0 refills | Status: DC

## 2024-06-06 NOTE — Telephone Encounter (Signed)
 Prescription sent thank you tell her to get someone to drive her

## 2024-06-07 NOTE — Telephone Encounter (Signed)
 I told her when when we scheduled the MRI.

## 2024-06-07 NOTE — Telephone Encounter (Signed)
 EEG order signed by Dr Tresia Fruit. Form faxed to [facility]. Received a receipt of confirmation.

## 2024-06-07 NOTE — Telephone Encounter (Signed)
 Received a notification that pt is scheduled for the In-Home Video EEG from 6/30-7/3.

## 2024-06-14 ENCOUNTER — Telehealth: Payer: Self-pay

## 2024-06-14 NOTE — Progress Notes (Signed)
 Electrophysiology Office Note:   Date:  06/15/2024  ID:  Wilder Maclachlan, DOB October 15, 2006, MRN [id number]  Primary Cardiologist: Soyla DELENA Merck, MD Electrophysiologist: Soyla Gladis Norton, MD      History of Present Illness:   Carolyn Pitts is a 18 y.o. female with h/o migraines, autism, syncope who is being seen today for evaluation   Discussed the use of AI scribe software for clinical note transcription with the patient, who gave verbal consent to proceed.  History of Present Illness Carolyn Pitts is an 17 year old female who presents with recurrent syncope and chest pain. She is accompanied by her mother. She was referred by Dr. Acharya for evaluation based on her syncope history and prior heart monitor results.  She experiences two distinct types of episodes. The first type resembles symptoms of postural orthostatic tachycardia syndrome (POTS), characterized by dizziness or lightheadedness, particularly when standing up quickly or not eating adequately, leading to syncope. The second type involves a tight chest pain, difficulty breathing, and shaking, with the pain persisting long after the episode. 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  During a recent significant episode at work, she felt lightheaded and dizzy, moved to dry storage, and was followed by a coworker who noticed her pallor. She lost consciousness and experienced shaking, leading coworkers to suspect a seizure. She had multiple episodes of regaining and losing consciousness, approximately five or six times. After consuming saltine crackers, water, and resting, she felt slightly better, but the chest pain persisted.  She has undergone prior cardiac evaluations, including wearing a heart monitor, a stress test, and an ultrasound of her heart, both of which were normal. She is scheduled for a 72-hour EEG to evaluate for seizures, following a quick EEG in the  hospital.  Her family history is significant for heart disease, with her grandfather, mother, and brother all having experienced heart attacks. This family history raises concerns about her cardiac health.  Her symptoms include episodes of chest pain, lightheadedness, dizziness, and syncope. During these episodes, she sometimes experiences confusion upon regaining consciousness. She also reports episodes while at rest, such as lying in bed, where her heart rate increases without exertion.  Interview conducted with patient and mother providing additional history.  Review of systems complete and found to be negative unless listed in HPI.   EP Information / Studies Reviewed:    EKG is ordered today. Personal review as below.  EKG Interpretation Date/Time:  Tuesday June 15 2024 08:17:16 EDT Ventricular Rate:  85 PR Interval:  156 QRS Duration:  90 QT Interval:  376 QTC Calculation: 447 R Axis:   86  Text Interpretation: Normal sinus rhythm Normal ECG When compared with ECG of 11-May-2024 10:55, No significant change was found Confirmed by Kennyth Chew [id number]) on 06/15/2024 10:06:37 AM   EKG 06/14/24:    Physical Exam:   VS:  BP 102/60   Pulse 85   Ht 5' 3 (1.6 m)   Wt 115 lb (52.2 kg)   SpO2 99%   BMI 20.37 kg/m    Wt Readings from Last 3 Encounters:  06/15/24 115 lb (52.2 kg) (28%, Z= -0.59)*  05/26/24 115 lb 3.2 oz (52.3 kg) (28%, Z= -0.57)*  05/11/24 114 lb (51.7 kg) (26%, Z= -0.64)*   * Growth percentiles are based on CDC (Girls, 2-20 Years) data.     GEN: Well nourished, well developed in no acute distress NECK: No JVD CARDIAC: Normal rate, regular  rhythm RESPIRATORY:  Clear to auscultation without rales, wheezing or rhonchi  ABDOMEN: Soft, non-distended EXTREMITIES:  No edema; No deformity   ASSESSMENT AND PLAN:    #. Syncope: Based on results of her cardiac monitor and patient's descriptions of events, I suspect vasovagal syncope. - Patient plans to be  evaluated by neurology and undergo 72-hour EEG to assess for neurologic causes of her syncope.  I encouraged her to pursue this. -She has been instructed to not drive for 6 months. -We will order a coronary CTA to assess for anomalous coronary artery that could explain her chest pain and/or syncopal episodes.  -Syncope precautions provided try to avoid/identify triggers. -Yesterday, completed prolonged cardiac monitor. She had 1 syncopal episode during this time. Episode obscured by artifact.  #. AV block: Patient has transient AV block, lasting only seconds, seen on prior ZIO monitor.  She is unsure what she was doing at the time of the episode or if she was symptomatic during these events.  I suspect this is vagally mediated, especially given her age.  No AV block seen on most recent extended monitor, only artifact. Vagally mediated etiology would also fit with her syncope.  #. ?POTS: No formal diagnosis.  She has normal resting heart rate today.  She has normal average heart rate on her Zio.  Many of her symptoms of palpitations actually correlated to normal heart rates and sinus rhythm. #. Palpitations: - Discussed referral for tilt table testing if she wishes. - Advised use of compression stockings. - Continue increased sodium and fluid intake. - Encouraged physical activity and exercise.  #. Chest pain:  -We will order a coronary CTA to assess for anomalous coronary artery that could explain her chest pain and/or syncopal episodes.  Echocardiogram and exercise EKG testing have been normal.  Follow up with EP APP in 4 weeks to follow up results of Neuro eval/EEG and coronary CTA.  We will submit for authorization for loop recorder implant to potentially be done at that visit.   Total time of encounter: 60 minutes total time of encounter, including chart review, face-to-face patient care, coordination of care and counseling regarding high complexity medical decision  making.  Signed, Fonda Kitty, MD

## 2024-06-14 NOTE — Telephone Encounter (Signed)
 Critical monitor alert from 06/11/24 at 7 pm. EKG tracing seems to show heart block. Call to patient who states that on 06/11/24 at 7 pm she passed out at work. She states her colleagues report she had 15-20 mins of altered mental status, and afterwards she had teeth chattering and shaking despite feeling hot. She declined EMS/ED at that time and called a family member to take her home. She checked her BP at home and it was 110. Patient last saw Dr. Chancy Comber on 05/11/24 for initial visit  and was referred to EP at that time, that appt is scheduled for 07/23/24.  Reviewed monitor with Dr. Katheryne Pane DOD who advised to get patient in to EP tomorrow. Appt changed to 06/15/24 at 8 AM. Call to patient to advise of appt tomorrow, also advised per Dr. Katheryne Pane that she is not to drive. Patient verbalizes understanding.

## 2024-06-15 ENCOUNTER — Ambulatory Visit: Attending: Internal Medicine

## 2024-06-15 ENCOUNTER — Ambulatory Visit: Attending: Cardiology | Admitting: Cardiology

## 2024-06-15 ENCOUNTER — Encounter: Payer: Self-pay | Admitting: Cardiology

## 2024-06-15 VITALS — BP 102/60 | HR 85 | Ht 63.0 in | Wt 115.0 lb

## 2024-06-15 DIAGNOSIS — R55 Syncope and collapse: Secondary | ICD-10-CM

## 2024-06-15 DIAGNOSIS — R079 Chest pain, unspecified: Secondary | ICD-10-CM

## 2024-06-15 DIAGNOSIS — R002 Palpitations: Secondary | ICD-10-CM | POA: Diagnosis not present

## 2024-06-15 NOTE — Patient Instructions (Signed)
 Medication Instructions:  Your physician recommends that you continue on your current medications as directed. Please refer to the Current Medication list given to you today.  *If you need a refill on your cardiac medications before your next appointment, please call your pharmacy*  Testing/Procedures: Cardiac CT Your physician has requested that you have cardiac CT. Cardiac computed tomography (CT) is a painless test that uses an x-ray machine to take clear, detailed pictures of your heart. For further information please visit [email or web address] Please follow instruction sheet as given.  Follow-Up: At [facility], you and your health needs are our priority.  As part of our continuing mission to provide you with exceptional heart care, our providers are all part of one team.  This team includes your primary Cardiologist (physician) and Advanced Practice Providers or APPs (Physician Assistants and Nurse Practitioners) who all work together to provide you with the care you need, when you need it.  Your next appointment:   4-6 weeks  Provider:   Merla Starch, PA-C

## 2024-06-16 ENCOUNTER — Ambulatory Visit (INDEPENDENT_AMBULATORY_CARE_PROVIDER_SITE_OTHER)

## 2024-06-16 DIAGNOSIS — R202 Paresthesia of skin: Secondary | ICD-10-CM | POA: Diagnosis not present

## 2024-06-16 DIAGNOSIS — R21 Rash and other nonspecific skin eruption: Secondary | ICD-10-CM

## 2024-06-16 DIAGNOSIS — R609 Edema, unspecified: Secondary | ICD-10-CM

## 2024-06-16 DIAGNOSIS — R55 Syncope and collapse: Secondary | ICD-10-CM

## 2024-06-16 DIAGNOSIS — Z79899 Other long term (current) drug therapy: Secondary | ICD-10-CM | POA: Diagnosis not present

## 2024-06-16 DIAGNOSIS — R5383 Other fatigue: Secondary | ICD-10-CM

## 2024-06-16 DIAGNOSIS — F39 Unspecified mood [affective] disorder: Secondary | ICD-10-CM

## 2024-06-16 DIAGNOSIS — R569 Unspecified convulsions: Secondary | ICD-10-CM

## 2024-06-16 MED ORDER — GADOBENATE DIMEGLUMINE 529 MG/ML IV SOLN
10.0000 mL | Freq: Once | INTRAVENOUS | Status: AC | PRN
  Administered 2024-06-16: 10 mL via INTRAVENOUS

## 2024-06-18 ENCOUNTER — Ambulatory Visit: Admitting: Physician Assistant

## 2024-07-01 DIAGNOSIS — R569 Unspecified convulsions: Secondary | ICD-10-CM

## 2024-07-01 DIAGNOSIS — R55 Syncope and collapse: Secondary | ICD-10-CM

## 2024-07-05 ENCOUNTER — Other Ambulatory Visit: Payer: Self-pay | Admitting: Neurology

## 2024-07-05 ENCOUNTER — Encounter (INDEPENDENT_AMBULATORY_CARE_PROVIDER_SITE_OTHER): Payer: Self-pay | Admitting: Neurology

## 2024-07-05 DIAGNOSIS — R55 Syncope and collapse: Secondary | ICD-10-CM

## 2024-07-05 NOTE — Procedures (Signed)
 Clinical History:  This is a 18 y/o F who presents with migraines and syncope. She had an event of becoming lightheaded and passing out with convulsions.   INTERMITTENT MONITORING with VIDEO TECHNICAL SUMMARY:  This AVEEG was performed using equipment provided by [employer] utilizing Bluetooth ( Trackit ) amplifiers with continuous EEGT attended video collection using encrypted remote transmission via [employer] secured cellular tower network with data rates for each AVEEG performed. This is a Therapist, music AVEEG, obtained, according to the 10-20 international electrode placement system, reformatted digitally into referential and bipolar montages. Data was acquired with a minimum of 21 bipolar connections and sampled at a minimum rate of 250 cycles per second per channel, maximum rate of 450 cycles per second per channel and two channels for EKG. The entire VEEG study was recorded through cable and or radio telemetry for subsequent analysis. Specified epochs of the AVEEG data were identified at the direction of the subject by the depression of a push button by the patient. Each patients event file included data acquired two minutes prior to the push button activation and continuing until two minutes afterwards. AVEEG files were reviewed on [employer] server, Licensed Software provided by [employer] with a digital high frequency filter set at 70 Hz and a low frequency filter set at 1 Hz with a paper speed of 36mm/s resulting in 10 seconds per digital page. This entire AVEEG was reviewed by the EEG Technologist. Random time samples, random sleep samples, clips, patient initiated push button files with included patient daily diary logs, EEG Technologist pruned data was reviewed and verified for accuracy and validity by the governing reading neurologist in full details. This AEEGV was fully compliant with all requirements for CPT 97500 for setup, patient education, take down and  administered by an EEG technologist.   Long-Term EEG with Video was monitored intermittently by a qualified EEG technologist for the entirety of the recording; quality check-ins were performed at a minimum of every two hours, checking and documenting real-time data and video to assure the integrity and quality of the recording (e.g., camera position, electrode integrity and impedance), and identify the need for maintenance. For intermittent monitoring, an EEG Technologist monitored no more than 12 patients concurrently. Diagnostic video was captured at least 80% of the time during the recording.   PATIENT EVENTS:  There were no patient events noted or captured during this recording.   TECHNOLOGIST EVENTS:  No clear epileptiform activity was detected by the reviewing neurodiagnostic technologist for further review.   TIME SAMPLES:  10-minutes of every 2 hours recorded are reviewed as random time samples.   SLEEP SAMPLES:  5-minutes of every 24 hours recorded are reviewed as random sleep samples.   AWAKE:  At maximal level of alertness, the posterior dominant background activity was continuous, reactive, low voltage rhythm of 9 Hz. This was symmetric, well-modulated, and attenuated with eye opening. Diffuse, symmetric, frontocentral beta range activity was present.   SLEEP:  N1 Sleep (Stage 1) was observed and characterized by the disappearance of alpha rhythm and the appearance of vertex activity.  N2 Sleep (Stage 2) was observed and characterized by vertex waves, K-complexes, and sleep spindles.   N3 (Stage 3) sleep was observed and characterized by high amplitude Delta activity of 20%.   REM sleep was observed.   EKG:  There were no arrhythmias or abnormalities noted during this recording.   Impression:  Normal EEG: awake and asleep   Clinical Correlation:  This  is a normal 3-day ambulatory EEG tracing. No focal abnormalities or epileptiform discharges were seen. There were no  electrographic seizures noted. No events were captured during the recording. Please note a normal EEG does not exclude the diagnosis of epilepsy.   Poetry Cerro, MD [facility]

## 2024-07-06 ENCOUNTER — Ambulatory Visit: Payer: Self-pay | Admitting: Neurology

## 2024-07-08 ENCOUNTER — Ambulatory Visit: Admitting: Neurology

## 2024-07-12 ENCOUNTER — Encounter (HOSPITAL_COMMUNITY): Payer: Self-pay

## 2024-07-13 ENCOUNTER — Telehealth (HOSPITAL_COMMUNITY): Payer: Self-pay | Admitting: Emergency Medicine

## 2024-07-13 NOTE — Progress Notes (Deleted)
  Electrophysiology Office Note:   Date:  07/13/2024  ID:  Carolyn Pitts, DOB 02-11-2006, MRN [id number]  Primary Cardiologist: Soyla DELENA Merck, MD Electrophysiologist: Soyla Gladis Norton, MD  {Click to update primary MD,subspecialty MD or APP then REFRESH:1}    History of Present Illness:   Carolyn Pitts is a 18 y.o. female with h/o migraines, autism and syncope seen today for routine electrophysiology followup.   Since last being seen in our clinic the patient reports doing ***.  she denies chest pain, palpitations, dyspnea, PND, orthopnea, nausea, vomiting, dizziness, syncope, edema, weight gain, or early satiety.   Review of systems complete and found to be negative unless listed in HPI.   EP Information / Studies Reviewed:    EKG is not ordered today. EKG from 06/15/2024 reviewed which showed NSR at 85 bpm       Arrhythmia/Device History Monitor worn 01/2024 Not felt to truly show bradycardia in setting of noise during syncopal episode.   Physical Exam:   VS:  There were no vitals taken for this visit.   Wt Readings from Last 3 Encounters:  06/15/24 115 lb (52.2 kg) (28%, Z= -0.59)*  05/26/24 115 lb 3.2 oz (52.3 kg) (28%, Z= -0.57)*  05/11/24 114 lb (51.7 kg) (26%, Z= -0.64)*   * Growth percentiles are based on CDC (Girls, 2-20 Years) data.     GEN: No acute distress NECK: No JVD; No carotid bruits CARDIAC: {EPRHYTHM:28826}, no murmurs, rubs, gallops RESPIRATORY:  Clear to auscultation without rales, wheezing or rhonchi  ABDOMEN: Soft, non-tender, non-distended EXTREMITIES:  {EDEMA LEVEL:28147::No} edema; No deformity   ASSESSMENT AND PLAN:    Syncope Normal awake and asleep EEG ETT low risk without arryhtmia or syncope.  Cardiac monitor with concerns for transient heart block, potential vagal. I spoke at length with the patient about monitoring for afib with an implantable loop recorder, including monthly monitor fees which may range from $0-$40.  Risks,  benefits, and alteratives to implantable loop recorder were discussed with the patient today. Pt verbalizes understanding and agrees to proceed today.    Atypical chest pain Coronary CT 7/16 ***  Intermittent AV Block Noted on monitor, lasting seconds and not clearly symptomatic ? Component of dysautonomia / vagal component  {Click here to Review PMH, Prob List, Meds, Allergies, SHx, FHx  :1}   Follow up with EP Team in person as needed. We will primarily remotely monitor her with the loop.  Signed, Ozell Prentice Passey, PA-C

## 2024-07-13 NOTE — Telephone Encounter (Signed)
 Reaching out to patient to offer assistance regarding upcoming cardiac imaging study; pt verbalizes understanding of appt date/time, parking situation and where to check in, pre-test NPO status and medications ordered, and verified current allergies; name and call back number provided for further questions should they arise Rockwell Alexandria RN Navigator Cardiac Imaging [facility] [phone] office [phone] cell

## 2024-07-14 ENCOUNTER — Ambulatory Visit (HOSPITAL_COMMUNITY): Admission: RE | Admit: 2024-07-14 | Source: Ambulatory Visit

## 2024-07-15 ENCOUNTER — Ambulatory Visit: Admitting: Student

## 2024-07-15 DIAGNOSIS — R55 Syncope and collapse: Secondary | ICD-10-CM

## 2024-07-15 DIAGNOSIS — R002 Palpitations: Secondary | ICD-10-CM

## 2024-07-15 DIAGNOSIS — R079 Chest pain, unspecified: Secondary | ICD-10-CM

## 2024-07-19 ENCOUNTER — Ambulatory Visit (HOSPITAL_COMMUNITY)
Admission: RE | Admit: 2024-07-19 | Discharge: 2024-07-19 | Disposition: A | Discharge status: Home / Self Care | Source: Ambulatory Visit | Attending: Cardiology

## 2024-07-19 DIAGNOSIS — R002 Palpitations: Secondary | ICD-10-CM | POA: Insufficient documentation

## 2024-07-19 DIAGNOSIS — R079 Chest pain, unspecified: Secondary | ICD-10-CM

## 2024-07-19 MED ORDER — IOHEXOL 350 MG/ML SOLN
100.0000 mL | Freq: Once | INTRAVENOUS | Status: AC | PRN
  Administered 2024-07-19: 100 mL via INTRAVENOUS

## 2024-07-19 MED ORDER — NITROGLYCERIN 0.4 MG SL SUBL
0.8000 mg | SUBLINGUAL_TABLET | Freq: Once | SUBLINGUAL | Status: AC
  Administered 2024-07-19: 0.8 mg via SUBLINGUAL

## 2024-07-21 ENCOUNTER — Ambulatory Visit: Payer: Self-pay | Admitting: Cardiology

## 2024-07-23 ENCOUNTER — Ambulatory Visit: Admitting: Cardiology

## 2024-08-01 NOTE — Progress Notes (Unsigned)
 Cardiology Office Note:  .   Date:  08/01/2024  ID:  Carolyn Pitts, DOB October 26, 2006, MRN [id number] PCP: [facility]  [facility] Providers Cardiologist:  Soyla DELENA Merck, MD Electrophysiologist:  Will Gladis Norton, MD {  History of Present Illness: .   Carolyn Pitts is a 18 y.o. female w/PMHx of  Migraine HAs, autism Syncope  Referred to Dr. Kennyth for recurrent syncope Described 2 type of events resembles symptoms of postural orthostatic tachycardia syndrome (POTS), characterized by dizziness or lightheadedness, particularly when standing up quickly or not eating adequately, leading to syncope  involves a tight chest pain, difficulty breathing, and shaking, with the pain persisting long after the episode. 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  She is unsure what she was doing at the time of the episode or if she was symptomatic during these events.  I suspect this is vagally mediated, especially given her age.  No AV block seen on most recent extended monitor, only artifact. Vagally mediated etiology would also fit with her syncope.   She was pending a 72hour EEG Planned for coronary CTa to further evaluate her PC  EEG: normal/negative Coronary CT: normal origin, no CAD  Today's visit is scheduled as follow +/- loop implant ROS:   *** symptoms *** syncope *** loop auth?   Studies Reviewed: SABRA    EKG done 06/15/24 and reviewed by myself:  SR 85bpm, normal  July 2025: 3day EEG Impression:  Normal EEG: awake and asleep  Clinical Correlation:   This is a normal 3-day ambulatory EEG tracing. No focal abnormalities or epileptiform discharges were seen. There were no electrographic seizures noted. No events were captured during the recording. Please note a normal EEG does not exclude the diagnosis of epilepsy.     07/19/24: Coronary CTA IMPRESSION: 1. Coronary calcium  score of 0. 2. Total plaque volume 0 mm3. 3. Normal coronary origin with right dominance. 4. Normal coronary arteries. IMPRESSION: No significant incidental noncardiac finding noted.  05/28/24: ETT   Patient exercised for 10 min and 0 sec. Maximum HR of 190 bpm. MPHR 94.0%. Peak METS 11.7.   No ST deviation was noted.   Stress ECG was negative for ischemia.   Prior study not available for comparison.  02/05/24: TTE 1. Left ventricular ejection fraction, by estimation, is 55 to 60%. The  left ventricle has normal function. The left ventricle has no regional  wall motion abnormalities. Left ventricular diastolic parameters were  normal. The average left ventricular  global longitudinal strain is -20.7 %. The global longitudinal strain is  normal.   2. Right ventricular systolic function is normal. The right ventricular  size is normal. There is moderately elevated pulmonary artery systolic  pressure.   3. The mitral valve is normal in structure. Trivial mitral valve  regurgitation. No evidence of mitral stenosis.   4. The aortic valve is normal in structure. Aortic valve regurgitation is  not visualized. No aortic stenosis is present.   5. The inferior vena cava is normal in size with greater than 50%  respiratory variability,  suggesting right atrial pressure of 3 mmHg.     Risk Assessment/Calculations:    Physical Exam:   VS:  There were no vitals taken for this visit.   Wt Readings from Last 3 Encounters:  06/15/24 115 lb (52.2 kg) (28%, Z= -0.59)*  05/26/24 115 lb 3.2 oz (52.3 kg) (28%, Z= -0.57)*  05/11/24 114 lb (51.7 kg) (26%, Z= -0.64)*   *  Growth percentiles are based on CDC (Girls, 2-20 Years) data.    GEN: Well nourished, well developed in no acute distress NECK: No JVD; No carotid bruits CARDIAC: ***RRR, no murmurs, rubs, gallops RESPIRATORY:  *** CTA b/l without rales, wheezing or rhonchi  ABDOMEN: Soft, non-tender, non-distended EXTREMITIES: *** No edema; No deformity   ASSESSMENT AND PLAN: .    syncope Palpitations ***     {Are you ordering a CV Procedure (e.g. stress test, cath, DCCV, TEE, etc)?   Press F2        :[id number]}     Dispo: ***  Signed, Charlies Macario Arthur, PA-C     *** SURGEON:  Charlies Arthur, PA-C ASSISTANT:     PREPROCEDURE DIAGNOSIS:  ***    POSTPROCEDURE DIAGNOSIS: ***     PROCEDURES:   1. Implantable loop recorder implantation    INTRODUCTION:  [name] presents with a history of palpitations The costs of loop recorder monitoring have been discussed with the patient.    DESCRIPTION OF PROCEDURE:  Informed written consent was obtained.   Time Out Completed with {Blank single:19197::CMA,RN}    The patient required no sedation for the procedure today.  Mapping over the patient's chest was performed to identify the area where electrograms were most prominent for ILR recording.  This area was found to be the left parasternal region over the 4th intercostal space. The patients left chest was therefore prepped and draped in the usual sterile fashion. The skin overlying the left parasternal region was infiltrated with lidocaine  for local analgesia.  A 0.5-cm incision was made over the left parasternal region over the 3rd intercostal space.  A subcutaneous ILR pocket was fashioned using a combination of sharp and blunt dissection.  A {LOOPLIST:27736} implantable loop recorder (serial # ***) was then placed into the pocket  R waves were very prominent and measuring {Blank single:19197::>0.62mV,***}.  Steri- Strips and a sterile dressing were then applied.  There were no early  apparent complications.     CONCLUSIONS:   1. Successful implantation of a implantable loop recorder for a history of palpitations.  2. No early apparent complications.   Charlies Arthur, PA-C Cardiac Electrophysiology

## 2024-08-03 ENCOUNTER — Ambulatory Visit: Attending: Physician Assistant

## 2024-08-03 ENCOUNTER — Ambulatory Visit: Attending: Physician Assistant | Admitting: Physician Assistant

## 2024-08-03 ENCOUNTER — Encounter: Payer: Self-pay | Admitting: Physician Assistant

## 2024-08-03 VITALS — BP 94/62 | HR 67 | Ht 63.0 in | Wt 115.8 lb

## 2024-08-03 DIAGNOSIS — R55 Syncope and collapse: Secondary | ICD-10-CM | POA: Diagnosis not present

## 2024-08-03 DIAGNOSIS — R002 Palpitations: Secondary | ICD-10-CM | POA: Diagnosis not present

## 2024-08-03 NOTE — Patient Instructions (Addendum)
 Medication Instructions:   Your physician recommends that you continue on your current medications as directed. Please refer to the Current Medication list given to you today.  *If you need a refill on your cardiac medications before your next appointment, please call your pharmacy*   Lab Work: NONE ORDERED  TODAY   If you have labs (blood work) drawn today and your tests are completely normal, you will receive your results only by: MyChart Message (if you have MyChart) OR A paper copy in the mail If you have any lab test that is abnormal or we need to change your treatment, we will call you to review the results.   Testing/Procedures: Your physician has recommended that you wear an event monitor. Event monitors are medical devices that record the heart's electrical activity. Doctors most often us  these monitors to diagnose arrhythmias. Arrhythmias are problems with the speed or rhythm of the heartbeat. The monitor is a small, portable device. You can wear one while you do your normal daily activities. This is usually used to diagnose what is causing palpitations/syncope (passing out).  (Contact about pots)  Dr. KYM Vivian [facility]) [phone]   Dorian Clarks NP ( [facility]) [phone]    Follow-Up: At [facility], you and your health needs are our priority.  As part of our continuing mission to provide you with exceptional heart care, our providers are all part of one team.  This team includes your primary Cardiologist (physician) and Advanced Practice Providers or APPs (Physician Assistants and Nurse Practitioners) who all work together to provide you with the care you need, when you need it.  Your next appointment:   2 month(s)  Provider:  You may see Dr Kennyth ONLY!!  CONTACT  CASSIE HALL/ ANGELINE HAMMER FOR EP SCHEDULING ISSUES )    We recommend signing up for the patient portal called MyChart.  Sign up information is provided on this After Visit  Summary.  MyChart is used to connect with patients for Virtual Visits (Telemedicine).  Patients are able to view lab/test results, encounter notes, upcoming appointments, etc.  Non-urgent messages can be sent to your provider as well.   To learn more about what you can do with MyChart, go to [email or web address].   Other Instructions      ZIO AT Long term monitor-Live Telemetry  Your physician has requested you wear a ZIO patch monitor for 14 days.  This is a single patch monitor. [employer] supplies one patch monitor per enrollment. Additional  stickers are not available.  Please do not apply patch if you will be having a Nuclear Stress Test, Echocardiogram, Cardiac CT, MRI,  or Chest Xray during the period you would be wearing the monitor. The patch cannot be worn during  these tests. You cannot remove and re-apply the ZIO AT patch monitor.  Your ZIO patch monitor will be mailed 3 day USPS to your address on file. It may take 3-5 days to  receive your monitor after you have been enrolled.  Once you have received your monitor, please review the enclosed instructions. Your monitor has  already been registered assigning a specific monitor serial # to you.   Billing and Patient Assistance Program information  Meredeth has been supplied with any insurance information on record for billing. [employer] offers a sliding scale Patient Assistance Program for patients without insurance, or whose  insurance does not completely cover the cost of the ZIO patch monitor. You must apply for the  Patient Assistance  Program to qualify for the discounted rate. To apply, call [employer] at [phone],  select option 4, select option 2 , ask to apply for the Patient Assistance Program, (you can request an  interpreter if needed). [employer] will ask your household income and how many people are in your  household. [employer] will quote your out-of-pocket cost based on this information. They will also be able  to set  up a 12 month interest free payment plan if needed.  Applying the monitor   Shave hair from upper left chest.  Hold the abrader disc by orange tab. Rub the abrader in 40 strokes over left upper chest as indicated in  your monitor instructions.  Clean area with 4 enclosed alcohol pads. Use all pads to ensure the area is cleaned thoroughly. Let  dry.  Apply patch as indicated in monitor instructions. Patch will be placed under collarbone on left side of  chest with arrow pointing upward.  Rub patch adhesive wings for 2 minutes. Remove the white label marked 1. Remove the white label  marked 2. Rub patch adhesive wings for 2 additional minutes.  While looking in a mirror, press and release button in center of patch. A small green light will flash 3-4  times. This will be your only indicator that the monitor has been turned on.  Do not shower for the first 24 hours. You may shower after the first 24 hours.  Press the button if you feel a symptom. You will hear a small click. Record Date, Time and Symptom in  the Patient Log.   Starting the Gateway  In your kit there is a Audiological scientist box the size of a cellphone. This is Buyer, retail. It transmits all your  recorded data to [facility]. This box must always stay within 10 feet of you. Open the box and push the *  button. There will be a light that blinks orange and then green a few times. When the light stops  blinking, the Gateway is connected to the ZIO patch. Call [employer] at [phone] to confirm your monitor is transmitting.  Returning your monitor  Remove your patch and place it inside the Gateway. In the lower half of the Gateway there is a white  bag with prepaid postage on it. Place Gateway in bag and seal. Mail package back to [place] as soon as  possible. Your physician should have your final report approximately 7 days after you have mailed back  your monitor. Call [facility] Customer Care at [phone] if you  have questions regarding your ZIO AT  patch monitor. Call them immediately if you see an orange light blinking on your monitor.  If your monitor falls off in less than 4 days, contact our Monitor department at [phone]. If your  monitor becomes loose or falls off after 4 days call [employer] at [phone] for suggestions on  securing your monitor

## 2024-08-03 NOTE — Progress Notes (Unsigned)
 Enrolled patient for a 14 day Zio AT monitor to be mailed to patients home   Carolyn Pitts to read

## 2024-08-04 ENCOUNTER — Other Ambulatory Visit: Payer: Self-pay | Admitting: Medical Genetics

## 2024-08-11 ENCOUNTER — Ambulatory Visit: Admitting: Physician Assistant

## 2024-10-08 ENCOUNTER — Other Ambulatory Visit: Payer: Self-pay | Admitting: Medical Genetics

## 2024-10-08 DIAGNOSIS — Z006 Encounter for examination for normal comparison and control in clinical research program: Secondary | ICD-10-CM

## 2024-10-21 ENCOUNTER — Ambulatory Visit: Admitting: Cardiology
